# Patient Record
Sex: Female | Born: 1980 | Race: White | Hispanic: No | Marital: Married | Smoking: Never smoker
Health system: Southern US, Community
[De-identification: ages and names within clinical notes are randomized; demographics above are authoritative.]

## PROBLEM LIST (undated history)

## (undated) DIAGNOSIS — R87612 Low grade squamous intraepithelial lesion on cytologic smear of cervix (LGSIL): Secondary | ICD-10-CM

## (undated) DIAGNOSIS — J45909 Unspecified asthma, uncomplicated: Secondary | ICD-10-CM

## (undated) DIAGNOSIS — T7840XA Allergy, unspecified, initial encounter: Secondary | ICD-10-CM

## (undated) DIAGNOSIS — Z6710 Type A blood, Rh positive: Secondary | ICD-10-CM

## (undated) DIAGNOSIS — E079 Disorder of thyroid, unspecified: Secondary | ICD-10-CM

## (undated) DIAGNOSIS — E559 Vitamin D deficiency, unspecified: Secondary | ICD-10-CM

## (undated) DIAGNOSIS — B019 Varicella without complication: Secondary | ICD-10-CM

## (undated) DIAGNOSIS — Z8759 Personal history of other complications of pregnancy, childbirth and the puerperium: Secondary | ICD-10-CM

## (undated) HISTORY — DX: Varicella without complication: B01.9

## (undated) HISTORY — DX: Unspecified asthma, uncomplicated: J45.909

## (undated) HISTORY — DX: Vitamin D deficiency, unspecified: E55.9

## (undated) HISTORY — DX: Allergy, unspecified, initial encounter: T78.40XA

## (undated) HISTORY — DX: Disorder of thyroid, unspecified: E07.9

## (undated) HISTORY — DX: Low grade squamous intraepithelial lesion on cytologic smear of cervix (LGSIL): R87.612

## (undated) HISTORY — DX: Personal history of other complications of pregnancy, childbirth and the puerperium: Z87.59

## (undated) HISTORY — PX: NO PAST SURGERIES: SHX2092

## (undated) HISTORY — DX: Type A blood, Rh positive: Z67.10

---

## 2010-11-30 DIAGNOSIS — R87612 Low grade squamous intraepithelial lesion on cytologic smear of cervix (LGSIL): Secondary | ICD-10-CM

## 2010-11-30 HISTORY — DX: Low grade squamous intraepithelial lesion on cytologic smear of cervix (LGSIL): R87.612

## 2011-06-25 ENCOUNTER — Other Ambulatory Visit: Payer: Self-pay | Admitting: Endocrinology

## 2011-06-25 DIAGNOSIS — E0590 Thyrotoxicosis, unspecified without thyrotoxic crisis or storm: Secondary | ICD-10-CM

## 2011-07-01 ENCOUNTER — Ambulatory Visit (HOSPITAL_COMMUNITY): Payer: Self-pay

## 2011-07-02 ENCOUNTER — Encounter (HOSPITAL_COMMUNITY): Payer: Self-pay

## 2011-07-03 ENCOUNTER — Encounter (HOSPITAL_COMMUNITY)
Admission: RE | Admit: 2011-07-03 | Discharge: 2011-07-03 | Disposition: A | Discharge status: Home / Self Care | Payer: BC Managed Care – PPO | Source: Ambulatory Visit | Attending: Endocrinology | Admitting: Endocrinology

## 2011-07-03 DIAGNOSIS — E0590 Thyrotoxicosis, unspecified without thyrotoxic crisis or storm: Secondary | ICD-10-CM

## 2011-07-04 ENCOUNTER — Ambulatory Visit (HOSPITAL_COMMUNITY)
Admission: RE | Admit: 2011-07-04 | Discharge: 2011-07-04 | Disposition: A | Discharge status: Home / Self Care | Payer: BC Managed Care – PPO | Source: Ambulatory Visit | Attending: Endocrinology | Admitting: Endocrinology

## 2011-07-04 DIAGNOSIS — E0590 Thyrotoxicosis, unspecified without thyrotoxic crisis or storm: Secondary | ICD-10-CM | POA: Insufficient documentation

## 2011-07-04 DIAGNOSIS — R000 Tachycardia, unspecified: Secondary | ICD-10-CM | POA: Insufficient documentation

## 2011-07-04 MED ORDER — SODIUM IODIDE I 131 CAPSULE
16.0000 | Freq: Once | INTRAVENOUS | Status: AC | PRN
  Administered 2011-07-03: 16 via ORAL

## 2013-09-12 IMAGING — NM NM THYROID UPTAKE SINGLE (24 HR)
1 series · 2 of 2 positions shown · non-contrast
Comparison: 

CLINICAL DATA: Hyperthyroidism.  Elevated heart rate. TSH equals
0.009

NUCLEAR MEDICINE 24 HOUR THYROID UPTAKE
TECHNIQUE: Following  per oral administered of Q-BFB, thyroid
radioiodine uptake was calculated at  24 hours.
Radiopharmaceutical: [id number] YALONDA Q-BFB SODIUM IODIDE I 131
CAPSULE,

[Series 0: static - general purpose · 1.60mm/px · 2 of 2 slices shown]
[im 1/2]
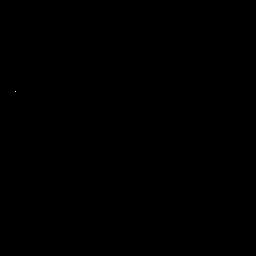
[im 2/2]
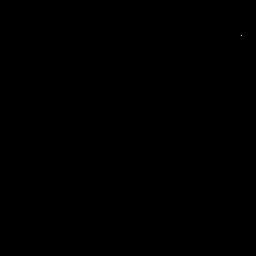

[2 of 2 positions shown; findings below may reference images not displayed]

FINDINGS: 24 hour I 131 uptake = 28.2 % (normal 10-30%
IMPRESSION: 24 hour I 131 uptake = 28.2 % (normal 10-30%)

## 2016-07-23 DIAGNOSIS — Z6710 Type A blood, Rh positive: Secondary | ICD-10-CM

## 2016-07-23 HISTORY — DX: Type A blood, Rh positive: Z67.10

## 2016-08-22 DIAGNOSIS — Z8759 Personal history of other complications of pregnancy, childbirth and the puerperium: Secondary | ICD-10-CM

## 2016-08-22 HISTORY — DX: Personal history of other complications of pregnancy, childbirth and the puerperium: Z87.59

## 2016-09-02 ENCOUNTER — Encounter: Payer: Self-pay | Admitting: Family Medicine

## 2016-09-02 ENCOUNTER — Ambulatory Visit (INDEPENDENT_AMBULATORY_CARE_PROVIDER_SITE_OTHER): Payer: 59 | Admitting: Family Medicine

## 2016-09-02 VITALS — BP 109/75 | HR 70 | Temp 98.3°F | Resp 18 | Ht 63.75 in | Wt 143.5 lb

## 2016-09-02 DIAGNOSIS — R194 Change in bowel habit: Secondary | ICD-10-CM | POA: Diagnosis not present

## 2016-09-02 DIAGNOSIS — R142 Eructation: Secondary | ICD-10-CM | POA: Diagnosis not present

## 2016-09-02 DIAGNOSIS — R140 Abdominal distension (gaseous): Secondary | ICD-10-CM

## 2016-09-02 DIAGNOSIS — R197 Diarrhea, unspecified: Secondary | ICD-10-CM | POA: Diagnosis not present

## 2016-09-02 DIAGNOSIS — Z7689 Persons encountering health services in other specified circumstances: Secondary | ICD-10-CM | POA: Diagnosis not present

## 2016-09-02 LAB — COMPREHENSIVE METABOLIC PANEL
ALBUMIN: 4.4 g/dL (ref 3.5–5.2)
ALK PHOS: 65 U/L (ref 39–117)
ALT: 13 U/L (ref 0–35)
AST: 15 U/L (ref 0–37)
BUN: 10 mg/dL (ref 6–23)
CO2: 31 mEq/L (ref 19–32)
Calcium: 9.6 mg/dL (ref 8.4–10.5)
Chloride: 105 mEq/L (ref 96–112)
Creatinine, Ser: 0.96 mg/dL (ref 0.40–1.20)
GFR: 69.12 mL/min (ref >60.00)
Glucose, Bld: 103 mg/dL — ABNORMAL HIGH (ref 70–99)
Potassium: 4.5 mEq/L (ref 3.5–5.1)
Sodium: 140 mEq/L (ref 135–145)
Total Bilirubin: 0.5 mg/dL (ref 0.2–1.2)
Total Protein: 7.1 g/dL (ref 6.0–8.3)

## 2016-09-02 LAB — H. PYLORI ANTIBODY, IGG: H PYLORI IGG: NEGATIVE

## 2016-09-02 NOTE — Patient Instructions (Signed)
First try to discontinue dairy for 4 weeks and see if there is a difference in symptoms.  We will test your blood work and call you with results.  Follow up and further studies will be discussed after results.   Lactose-Free Diet, Adult If you have lactose intolerance, you are not able to digest lactose. Lactose is a natural sugar found mainly in milk and milk products. You may need to avoid all foods and beverages that contain lactose. A lactose-free diet can help you do this. What do I need to know about this diet?  Do not consume foods, beverages, vitamins, minerals, or medicines with lactose. Read ingredients lists carefully.  Look for the words "lactose-free" on labels.  Use lactase enzyme drops or tablets as directed by your health care provider.  Use lactose-free milk or a milk alternative, such as soy milk, for drinking and cooking.  Make sure you get enough calcium and vitamin D in your diet. A lactose-free eating plan can be lacking in these important nutrients.  Take calcium and vitamin D supplements as directed by your health care provider. Talk to your provider about supplements if you are not able to get enough calcium and vitamin D from food. Which foods have lactose? Lactose is found in:  Milk and foods made from milk.  Yogurt.  Cheese.  Butter.  Margarine.  Sour cream.  Cream.  Whipped toppings and nondairy creamers.  Ice cream and other milk-based desserts.  Lactose is also found in foods or products made with milk or milk ingredients. To find out whether a food contains milk or a milk ingredient, look at the ingredients list. Avoid foods with the statement "May contain milk" and foods that contain:  Butter.  Cream.  Milk.  Milk solids.  Milk powder.  Whey.  Curd.  Caseinate.  Lactose.  Lactalbumin.  Lactoglobulin.  What are some alternatives to milk and foods made with milk products?  Lactose-free milk.  Soy milk with added  calcium and vitamin D.  Almond, coconut, or rice milk with added calcium and vitamin D. Note that these are low in protein.  Soy products, such as soy yogurt, soy cheese, soy ice cream, and soy-based sour cream. Which foods can I eat? Grains Breads and rolls made without milk, such as [place], [place], or [place] bread, bagels, pita, and [employer]. Corn tortillas, corn meal, grits, and polenta. Crackers without lactose or milk solids, such as soda crackers and graham crackers. Cooked or dry cereals without lactose or milk solids. Pasta, quinoa, couscous, barley, oats, bulgur, farro, rice, wild rice, or other grains prepared without milk or lactose. Plain popcorn. Vegetables Fresh, frozen, and canned vegetables without cheese, cream, or butter sauces. Fruits All fresh, canned, frozen, or dried fruits that are not processed with lactose. Meats and Other Protein Sources Plain beef, chicken, fish, [place], lamb, veal, pork, wild game, or ham. Kosher-prepared meat products. Strained or junior meats that do not contain milk. Eggs. Soy meat substitutes. Beans, lentils, and hummus. Tofu. Nuts and seeds. Peanut or other nut butters without lactose. Soups, casseroles, and mixed dishes without cheese, cream, or milk. Dairy Lactose-free milk. Soy, rice, or almond milk with added calcium and vitamin D. Soy cheese and yogurt. Beverages Carbonated drinks. Tea. Coffee, freeze-dried coffee, and some instant coffees. Fruit and vegetable juices. Condiments Soy sauce. Carob powder. Olives. Gravy made with water. Baker's cocoa. Rosita Fire. Pure seasonings and spices. Ketchup. Mustard. Bouillon. Broth. Sweets and Desserts Water and fruit ices. Gelatin. Cookies, pies,  or cakes made from allowed ingredients, such as angel food cake. Pudding made with water or a milk substitute. Lactose-free tofu desserts. Soy, coconut milk, or rice-milk-based frozen desserts. Sugar. Honey. Jam, jelly, and marmalade. Molasses. Pure sugar candy.  Dark chocolate without milk. Marshmallows. Fats and Oils Margarines and salad dressings that do not contain milk. Tomasa BlaseBacon. Vegetable oils. Shortening. Mayonnaise. Soy or coconut-based cream. The items listed above may not be a complete list of recommended foods or beverages. Contact your dietitian for more options. Which foods are not recommended? Grains Breads and rolls that contain milk. Toaster pastries. Muffins, biscuits, waffles, cornbread, and pancakes. These can be prepared at home, commercial, or from mixes. Sweet rolls, donuts, English muffins, fry bread, lefse, flour tortillas with lactose, or [place] toast made with milk or milk ingredients. Crackers that contain lactose. Corn curls. Cooked or dry cereals with lactose. Vegetables Creamed or breaded vegetables. Vegetables in a cheese or butter sauce or with lactose-containing margarines. Instant potatoes. [place] fries. Scalloped or au gratin potatoes. Fruits None. Meats and Other Protein Sources Scrambled eggs, omelets, and souffles that contain milk. Creamed or breaded meat, fish, chicken, or [place]. Sausage products, such as wieners and liver sausage. Cold cuts that contain milk solids. Cheese, cottage cheese, ricotta cheese, and cheese spreads. Lasagna and macaroni and cheese. Pizza. Peanut or other nut butters with added milk solids. Casseroles or mixed dishes containing milk or cheese. Dairy All dairy products, including milk, goat's milk, buttermilk, kefir, acidophilus milk, flavored milk, evaporated milk, condensed milk, dulce de [place], eggnog, yogurt, cheese, and cheese spreads. Beverages Hot chocolate. Cocoa with lactose. Instant iced teas. Powdered fruit drinks. Smoothies made with milk or yogurt. Condiments Chewing gum that has lactose. Cocoa that has lactose. Spice blends if they contain milk products. Artificial sweeteners that contain lactose. Nondairy creamers. Sweets and Desserts Ice cream, ice milk, gelato, sherbet, and  frozen yogurt. Custard, pudding, and mousse. Cake, cream pies, cookies, and other desserts containing milk, cream, cream cheese, or milk chocolate. Pie crust made with milk-containing margarine or butter. Reduced-calorie desserts made with a sugar substitute that contains lactose. Toffee and butterscotch. Milk, white, or dark chocolate that contains milk. Fudge. Caramel. Fats and Oils Margarines and salad dressings that contain milk or cheese. Cream. Half and half. Cream cheese. Sour cream. Chip dips made with sour cream or yogurt. The items listed above may not be a complete list of foods and beverages to avoid. Contact your dietitian for more information. Am I getting enough calcium? Calcium is found in many foods that contain lactose and is important for bone health. The amount of calcium you need depends on your age:  Adults younger than 50 years: 1000 mg of calcium a day.  Adults older than 50 years: 1200 mg of calcium a day.  If you are not getting enough calcium, other calcium sources include:  Orange juice with calcium added. There are 300-350 mg of calcium in 1 cup of orange juice.  Sardines with edible bones. There are 325 mg of calcium in 3 oz of sardines.  Calcium-fortified soy milk. There are 300-400 mg of calcium in 1 cup of calcium-fortified soy milk.  Calcium-fortified rice or almond milk. There are 300 mg of calcium in 1 cup of calcium-fortified rice or almond milk.  Canned salmon with edible bones. There are 180 mg of calcium in 3 oz of canned salmon with edible bones.  Calcium-fortified breakfast cereals. There are [phone] mg of calcium in calcium-fortified breakfast cereals.  Tofu  set with calcium sulfate. There are 250 mg of calcium in  cup of tofu set with calcium sulfate.  Spinach, cooked. There are 145 mg of calcium in  cup of cooked spinach.  Edamame, cooked. There are 130 mg of calcium in  cup of cooked edamame.  Collard greens, cooked. There are 125 mg of  calcium in  cup of cooked collard greens.  Kale, frozen or cooked. There are 90 mg of calcium in  cup of cooked or frozen kale.  Almonds. There are 95 mg of calcium in  cup of almonds.  Broccoli, cooked. There are 60 mg of calcium in 1 cup of cooked broccoli.  This information is not intended to replace advice given to you by your health care provider. Make sure you discuss any questions you have with your health care provider. Document Released: 08/30/2001 Document Revised: 08/16/2015 Document Reviewed: 06/10/2013 Elsevier Interactive Patient Education  2018 [employer].   Please help [place] help you:  We are honored you have chosen [facility] for your Primary Care home. Below you will find basic instructions that you may need to access in the future. Please help [place] help you by reading the instructions, which cover many of the frequent questions we experience.   Prescription refills and request:  -In order to allow more efficient response time, please call your pharmacy for all refills. They will forward the request electronically to [place]. This allows for the quickest possible response. Request left on a nurse line can take longer to refill, since these are checked as time allows between office patients and other phone calls.  - refill request can take up to 3-5 working days to complete.  - If request is sent electronically and request is appropiate, it is usually completed in 1-2 business days.  - all patients will need to be seen routinely for all chronic medical conditions requiring prescription medications (see follow-up below). If you are overdue for follow up on your condition, you will be asked to make an appointment and we will call in enough medication to cover you until your appointment (up to 30 days).  - all controlled substances will require a face to face visit to request/refill.  - if you desire your prescriptions to go through a new pharmacy, and have an active script at  original pharmacy, you will need to call your pharmacy and have scripts transferred to new pharmacy. This is completed between the pharmacy locations and not by your provider.    Results: If any images or labs were ordered, it can take up to 1 week to get results depending on the test ordered and the lab/facility running and resulting the test. - Normal or stable results, which do not need further discussion, may be released to your mychart immediately with attached note to you. A call may not be generated for normal results. Please make certain to sign up for mychart. If you have questions on how to activate your mychart you can call the front office.  - If your results need further discussion, our office will attempt to contact you via phone, and if unable to reach you after 2 attempts, we will release your abnormal result to your mychart with instructions.  - All results will be automatically released in mychart after 1 week.  - Your provider will provide you with explanation and instruction on all relevant material in your results. Please keep in mind, results and labs may appear confusing or abnormal to the  untrained eye, but it does not mean they are actually abnormal for you personally. If you have any questions about your results that are not covered, or you desire more detailed explanation than what was provided, you should make an appointment with your provider to do so.   Our office handles many outgoing and incoming calls daily. If we have not contacted you within 1 week about your results, please check your mychart to see if there is a message first and if not, then contact our office.  In helping with this matter, you help decrease call volume, and therefore allow [place] to be able to respond to patients needs more efficiently.   Acute office visits (sick visit):  An acute visit is intended for a new problem and are scheduled in shorter time slots to allow schedule openings for patients with  new problems. This is the appropriate visit to discuss a new problem. In order to provide you with excellent quality medical care with proper time for you to explain your problem, have an exam and receive treatment with instructions, these appointments should be limited to one new problem per visit. If you experience a new problem, in which you desire to be addressed, please make an acute office visit, we save openings on the schedule to accommodate you. Please do not save your new problem for any other type of visit, let us take care of it properly and quickly for you.   Follow up visits:  Depending on your condition(s) your provider will need to see you routinely in order to provide you with quality care and prescribe medication(s). Most chronic conditions (Example: hypertension, Diabetes, depression/anxiety... etc), require visits a couple times a year. Your provider will instruct you on proper follow up for your personal medical conditions and history. Please make certain to make follow up appointments for your condition as instructed. Failing to do so could result in lapse in your medication treatment/refills. If you request a refill, and are overdue to be seen on a condition, we will always provide you with a 30 day script (once) to allow you time to schedule.    Medicare wellness (well visit): - we have a wonderful Nurse Selena Batten), that will meet with you and provide you will yearly medicare wellness visits. These visits should occur yearly (can not be scheduled less than 1 calendar year apart) and cover preventive health, immunizations, advance directives and screenings you are entitled to yearly through your medicare benefits. Do not miss out on your entitled benefits, this is when medicare will pay for these benefits to be ordered for you.  These are strongly encouraged by your provider and is the appropriate type of visit to make certain you are up to date with all preventive health benefits. If you  have not had your medicare wellness exam in the last 12 months, please make certain to schedule one by calling the office and schedule your medicare wellness with Selena Batten as soon as possible.   Yearly physical (well visit):  - Adults are recommended to be seen yearly for physicals. Check with your insurance and date of your last physical, most insurances require one calendar year between physicals. Physicals include all preventive health topics, screenings, medical exam and labs that are appropriate for gender/age and history. You may have fasting labs needed at this visit. This is a well visit (not a sick visit), new problems should not be covered during this visit (see acute visit).  - Pediatric patients are seen more  frequently when they are younger. Your provider will advise you on well child visit timing that is appropriate for your their age. - This is not a medicare wellness visit. Medicare wellness exams do not have an exam portion to the visit. Some medicare companies allow for a physical, some do not allow a yearly physical. If your medicare allows a yearly physical you can schedule the medicare wellness with our nurse Selena Batten and have your physical with your provider after, on the same day. Please check with insurance for your full benefits.   Late Policy/No Shows:  - all new patients should arrive 15-30 minutes earlier than appointment to allow [place] time  to  obtain all personal demographics,  insurance information and for you to complete office paperwork. - All established patients should arrive 10-15 minutes earlier than appointment time to update all information and be checked in .  - In our best efforts to run on time, if you are late for your appointment you will be asked to either reschedule or if able, we will work you back into the schedule. There will be a wait time to work you back in the schedule,  depending on availability.  - If you are unable to make it to your appointment as scheduled,  please call 24 hours ahead of time to allow [place] to fill the time slot with someone else who needs to be seen. If you do not cancel your appointment ahead of time, you may be charged a no show fee.

## 2016-09-02 NOTE — Progress Notes (Signed)
Patient ID: Molly Torres, female  DOB: January 24, 1981, 36 y.o.   MRN: [id number] Patient Care Team    Relationship Specialty Notifications Start End  Ma Hillock, DO PCP - General Family Medicine  09/02/16   Delsa Bern, MD Consulting Physician Obstetrics and Gynecology  09/02/16     Chief Complaint  Patient presents with  . Establish Care  . GI Problem    bloating,gas,strong smell to BM    Subjective:  Molly Torres is a 36 y.o.  female present for new patient establishment. All past medical history, surgical history, allergies, family history, immunizations, medications and social history were obtained in the electronic medical record today. All recent labs, ED visits and hospitalizations within the last year were reviewed.  GI discomfort: Pt presents for new pt establishment with complaints of GI discomfort and bowel changes since January. She unfortunately just experienced a miscarriage last week. She reports she was [redacted] weeks pregnant and found by [place] that the fetus did not have a heartbeat and measured 8 weeks only. She states GI symptoms started prior to pregnancy. She feels she has had 10 # of weight gain that she has been unable to lose, belching increase, bloating. She reports  abdominal cramping and  Loose/watery stools. She has one BM day. She feels the stools have an increase in odor, lighter color and more cramping over the last few weeks. She denies diet changes, travel, antibiotic use, camping prior to onset. She denies h/o IBS/IBD personally or in her family. Her father had stomach cancer, that she states was misdiagnosed as GERD-like symptoms. She denies melena, hematochezia, weight loss, early satiety, weight loss or vomiting.  She reports no prior h/o food allergies, GERD, celiac or lactose intolerance.  She has h/o thyroid disorder. TSH (1.18) normal 07/2016 at GYN office, thyroid panel completed as well an normal., CBC normal. Vit D was mildly low at 27. Reports prior  endocrinologist, iodine tracer and methimazole use.   Health maintenance:  Colonoscopy: no fhx colon cancer, father with Gastric cancer.  Mammogram: No fhx, screen at 38, pt has GYN Cervical cancer screening: established with GYN, Miscarriage last week. Last pap ? 11/30/2010 with LGSIL, mild dysplasia per EMR. Do not see later records or follow up. Current Ob/GYN Dr. Cletis Media.  Immunizations:unknown immunization record.  Infectious disease screening: HIV unknown screening.    Depression screen [facility] 2/9 09/02/2016  Decreased Interest 0  Down, Depressed, Hopeless 0  PHQ - 2 Score 0   No flowsheet data found.  Current Exercise Habits: Home exercise routine;Structured exercise class, Type of exercise: walking (eliptical/stationary bike), Time (Minutes): 25, Frequency (Times/Week): 5, Weekly Exercise (Minutes/Week): 125, Intensity: Moderate   Fall Risk  09/02/2016  Falls in the past year? No    There is no immunization history on file for this patient.  No exam data present  Past Medical History:  Diagnosis Date  . Allergy   . Asthma    not as much as an issue since moving.   . Blood type A+ 07/23/2016  . Chicken pox   . 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 Miscarriage within last 12 months 08/22/2016  . Thyroid disease    hyperthyroid  . Vitamin D insufficiency    Allergies  Allergen Reactions  . Poultry Meal     Other reaction(s): Edema   Past Surgical History:  Procedure Laterality Date  . NO PAST SURGERIES  Family History  Problem Relation Age of Onset  . Arthritis Mother   . COPD Mother   . Kidney failure Mother   . Stomach cancer Father   . Asthma Father   . Brain cancer Paternal Aunt   . Heart disease Maternal Grandmother   . Stroke Maternal Grandfather   . Liver disease Paternal Grandmother   . Stroke Paternal Grandfather   . Celiac disease Paternal Aunt    Social History   Social History   . Marital status: Married    Spouse name: Shanon Brow  . Number of children: 2  . Years of education: 35   Occupational History  . [employer]    Social History Main Topics  . Smoking status: Never Smoker  . Smokeless tobacco: Never Used  . Alcohol use 1.2 - 1.8 oz/week    1 - 2 Glasses of wine, 1 Cans of beer per week  . Drug use: No  . Sexual activity: Yes    Partners: Male   Other Topics Concern  . Not on file   Social History Narrative   Married to [place]. They have 2 children.    Master's Degree. SAHM.    Drinks caffeine. Uses herbal remedies. Takes a daily vitamin.    Wears her seatbelt, smoke detector in the home.    exercisers routinely.    Feels safe in her relationships.    Allergies as of 09/02/2016      Reactions   Poultry Meal    Other reaction(s): Edema      Medication List       Accurate as of 09/02/16 11:59 PM. Always use your most recent med list.          ADVAIR DISKUS 250-50 MCG/DOSE Aepb Generic drug:  Fluticasone-Salmeterol Inhale 1 puff into the lungs 2 (two) times daily.   Albuterol Sulfate 108 (90 Base) MCG/ACT Aepb Inhale 1 puff into the lungs every 4 (four) hours as needed.       All past medical history, surgical history, allergies, family history, immunizations andmedications were updated in the EMR today and reviewed under the history and medication portions of their EMR.    Recent Results (from the past 2160 hour(s))  Comp Met (CMET)     Status: Abnormal   Collection Time: 09/02/16  1:45 PM  Result Value Ref Range   Sodium 140 135 - 145 mEq/L   Potassium 4.5 3.5 - 5.1 mEq/L   Chloride 105 96 - 112 mEq/L   CO2 31 19 - 32 mEq/L   Glucose, Bld 103 (H) 70 - 99 mg/dL   BUN 10 6 - 23 mg/dL   Creatinine, Ser 0.96 0.40 - 1.20 mg/dL   Total Bilirubin 0.5 0.2 - 1.2 mg/dL   Alkaline Phosphatase 65 39 - 117 U/L   AST 15 0 - 37 U/L   ALT 13 0 - 35 U/L   Total Protein 7.1 6.0 - 8.3 g/dL   Albumin 4.4 3.5 - 5.2 g/dL   Calcium 9.6 8.4 - 10.5 mg/dL    GFR 69.12 >60.00 mL/min  H. pylori antibody, IgG     Status: None   Collection Time: 09/02/16  1:45 PM  Result Value Ref Range   H Pylori IgG Negative Negative    Nm Thyroid Uptake Single  Result Date: 07/04/2011 *RADIOLOGY REPORT* Clinical Data: Hyperthyroidism.  Elevated heart rate. TSH equals 0.009 NUCLEAR MEDICINE 24 HOUR THYROID UPTAKE Technique:  Following  per oral administered of I-131, thyroid radioiodine uptake was  calculated at  24 hours. Radiopharmaceutical: 16MICRO CURIE I-131 SODIUM IODIDE I 131 CAPSULE, Comparison: Findings: 24 hour I 131 uptake = 28.2 % (normal 10-30% IMPRESSION: 24 hour I 131 uptake = 28.2 % (normal 10-30%) Original Report Authenticated By: Suzy Bouchard, M.D.    ROS: 14 pt review of systems performed and negative (unless mentioned in an HPI)  Objective: BP 109/75 (BP Location: Right Arm, Patient Position: Sitting, Cuff Size: Normal)   Pulse 70   Temp 98.3 F (36.8 C)   Resp 18   Ht 5' 3.75" (1.619 m)   Wt 143 lb 8 oz (65.1 kg)   LMP 08/29/2016   SpO2 98%   BMI 24.83 kg/m  Gen: Afebrile. No acute distress. Nontoxic in appearance, well-developed, well-nourished,  Pleasant caucasian female. HENT: AT. [place]. MMM, no oral lesions Eyes:Pupils Equal Round Reactive to light, Extraocular movements intact,  Conjunctiva without redness, discharge or icterus. Neck/lymp/endocrine: Supple,no lymphadenopathy, no thyromegaly CV: RRR no murmur, no edema, +2/4 P posterior tibialis pulses.  Chest: CTAB, no wheeze, rhonchi or crackles.  Abd: Soft. flat. NTND. BS present. no Masses palpated. No hepatosplenomegaly. No rebound tenderness or guarding. Skin: no rashes, purpura or petechiae. Warm and well-perfused. Skin intact. Neuro/Msk:  Normal gait. PERLA. EOMi. Alert. Oriented x3.   Psych: Normal affect, dress and demeanor. Normal speech. Normal thought content and judgment.   Assessment/plan: Molly Torres is a 36 y.o. female present for establish care.    Belching/Bloating Diarrhea, unspecified type/change in bowel habits - new problem - hesitant to perform inflammatory markers with recent Miscarriage ( less than 1 week ago). Per pt all GI symptoms preceded pregnancy. -  I am unable to verify consistent GYN followup after abnormal PAP in 2012. Pt did not mention abnormal PAP or date of her last PAP. Results were found by EMR search. She reports she does have an appt coming up next week to follow up with new GYN.  - Comp Met (CMET) - H. pylori antibody, IgG - Gliadin antibodies, serum - Tissue transglutaminase, IgA  - Reticulin Antibody, IgA w reflex titer - DDX; thyroid, GERD, H.Pylori, allergy/sensitivy, pancreatic etiology ([place] in father "stomach cancer"), IBS/IBD, GYN cause with CIN 1 in 2012. - if labs negative, stool studies should be obtained.  - avoid diary for 4 weeks for trial.  - could consider: bentyl and/or h. Pylori tx if appropriate, ALIGN probiotic - low threshold to GI refer or image with Fhx. Return if symptoms worsen or fail to improve.  Close follow up, dependent on labs.   Greater than 45 minutes was spent with patient, greater than 50% of that time was spent face-to-face with patient counseling and/or coordinating care. All history obtained and chart built today, as well EMR review for additional history and prior studies/lab reviewed.    Note is dictated utilizing voice recognition software. Although note has been proof read prior to signing, occasional typographical errors still can be missed. If any questions arise, please do not hesitate to call for verification.   Electronically signed by: Howard Pouch, DO [place]

## 2016-09-03 ENCOUNTER — Encounter: Payer: Self-pay | Admitting: Family Medicine

## 2016-09-03 DIAGNOSIS — R194 Change in bowel habit: Secondary | ICD-10-CM | POA: Insufficient documentation

## 2016-09-04 LAB — GLIADIN ANTIBODIES, SERUM
GLIADIN IGA: 5 U (ref <20)
GLIADIN IGG: 5 U (ref <20)

## 2016-09-04 LAB — RETICULIN ANTIBODIES, IGA W TITER: RETICULIN AB, IGA: NEGATIVE

## 2016-09-04 LAB — TISSUE TRANSGLUTAMINASE, IGA: Tissue Transglutaminase Ab, IgA: 1 U/mL (ref <4)

## 2016-09-05 ENCOUNTER — Telehealth: Payer: Self-pay | Admitting: Family Medicine

## 2016-09-05 DIAGNOSIS — R194 Change in bowel habit: Secondary | ICD-10-CM

## 2016-09-05 DIAGNOSIS — R197 Diarrhea, unspecified: Secondary | ICD-10-CM

## 2016-09-05 NOTE — Telephone Encounter (Signed)
Spoke with patient reviewed lab reuslts and information. Patient has not had a PAP since 2012 but she will schedule with her GYN for follow up. Patient will pick up stool kit. Place stool kit at front desk for patient pick up.

## 2016-09-05 NOTE — Telephone Encounter (Signed)
Please call pt: - her labs are all normal.  - I would like her to start a probiotic (ALIGN) is OTC and a good choice. This will aide in GI health.  - continue to cut back on dairy. - please ask if she has had a PAP since 11/2010? She was referred to GYN by her records at that time for abnormal PAP, but I do not have records of any follow up.  - I would like her to also have stool studies. She will need to pick up the kit and complete as soon as possible. Follow up 2-3 weeks, after above dietary changes. If symptoms still present will need to further investigate.

## 2016-09-09 LAB — FECAL LACTOFERRIN, QUANT: Lactoferrin: POSITIVE

## 2016-09-12 ENCOUNTER — Telehealth: Payer: Self-pay | Admitting: Family Medicine

## 2016-09-12 LAB — STOOL CULTURE

## 2016-09-12 NOTE — Telephone Encounter (Signed)
Patient inquiring if results for stool tests are available yet?  Please advise

## 2016-09-12 NOTE — Telephone Encounter (Signed)
Please call pt: - Pt should be educated labs can take up to 1 week to return. The results were to be discussed fully on her FOLLOW UP APPT WHICH SHE DID NOT SCHEDULE AND MUST SCHEDULE NOW! - Briefly, her stool sample received 09/08/2016 to lab did result this morning and did not grow any concerning bacteria, but they are continue to hold for regrowth. We will only hear back from them if it does have growth at this point.  - It did show some white cells, which could be inflammation but is non-specific. She is to continue the recommendations of avoiding dairy products and start probiotic use daily.  - further recommendations and/or investigation, if appropriate, will be discussed and ordered at her follow up visit.

## 2016-09-12 NOTE — Telephone Encounter (Signed)
Left message for patient see previous note

## 2016-09-12 NOTE — Telephone Encounter (Signed)
Left detailed message for patient to return call to schedule a follow up appt for next week to review lab results in detail.[facility]

## 2016-09-15 ENCOUNTER — Telehealth: Payer: Self-pay | Admitting: Family Medicine

## 2016-09-15 NOTE — Telephone Encounter (Signed)
Spoke with patient let her know the appointment is to review lab results and further recommendations.

## 2016-09-15 NOTE — Telephone Encounter (Signed)
Patient is requesting CB. She has made an appointment for Thursday 6/28 but would like to know what she will be talking to Dr. Claiborne BillingsKuneff about.

## 2016-09-18 ENCOUNTER — Encounter: Payer: Self-pay | Admitting: Family Medicine

## 2016-09-18 ENCOUNTER — Ambulatory Visit (INDEPENDENT_AMBULATORY_CARE_PROVIDER_SITE_OTHER): Payer: 59 | Admitting: Family Medicine

## 2016-09-18 VITALS — BP 115/77 | HR 90 | Temp 98.6°F | Resp 18 | Ht 64.0 in | Wt 142.8 lb

## 2016-09-18 DIAGNOSIS — R194 Change in bowel habit: Secondary | ICD-10-CM | POA: Diagnosis not present

## 2016-09-18 DIAGNOSIS — R142 Eructation: Secondary | ICD-10-CM

## 2016-09-18 DIAGNOSIS — R197 Diarrhea, unspecified: Secondary | ICD-10-CM | POA: Diagnosis not present

## 2016-09-18 DIAGNOSIS — R140 Abdominal distension (gaseous): Secondary | ICD-10-CM | POA: Diagnosis not present

## 2016-09-18 LAB — C-REACTIVE PROTEIN

## 2016-09-18 LAB — SEDIMENTATION RATE: Sed Rate: 3 mm/hr (ref 0–20)

## 2016-09-18 MED ORDER — OMEPRAZOLE 40 MG PO CPDR: 40.0000 mg | DELAYED_RELEASE_CAPSULE | Freq: Every day | ORAL | 2 refills | Status: DC

## 2016-09-18 NOTE — Progress Notes (Signed)
Patient ID: Molly Torres, female  DOB: [birth year], 36 y.o.   MRN: [id number] Patient Care Team    Relationship Specialty Notifications Start End  Ma Hillock, DO PCP - General Family Medicine  09/02/16   Delsa Bern, MD Consulting Physician Obstetrics and Gynecology  09/02/16     Chief Complaint  Patient presents with  . Results    labs    Subjective:  Molly Torres is a 36 y.o.  female present for followup  GI discomfort:  Pt presents for follow up on her GI discomfort/stool changes. She started the [facility] and has been monitoring her food/triggers. She did not feel the lactose/milk products avoidance Has made that much difference. She had noticed fast food seems to make her condition worse. Her labs were reviewed with her today in detail and explained Stool cultures normal, CMP unremarkable, CBC at GYN office WNL, Celiac panel negative, Lactoferrin POSITIVE. Inflammatory markers and FOBT were unable to be obtained at first visit secondary to pt was going through a miscarriage. She still endorses belching, bloating and bowel habit changes. She reports she only has 1 bowel movement a day now, however the consistency, color and amount has changed. She has not had any unintentional weight loss, and actually complains of weight gain.  Prior note 09/02/2016: Pt presents for new pt establishment with complaints of GI discomfort and bowel changes since January. She unfortunately just experienced a miscarriage last week. She reports she was [redacted] weeks pregnant and found by [place] that the fetus did not have a heartbeat and measured 8 weeks only. She states GI symptoms started prior to pregnancy. She feels she has had 10 # of weight gain that she has been unable to lose, belching increase, bloating. She reports  abdominal cramping and  Loose/watery stools. She has one BM day. She feels the stools have an increase in odor, lighter color and more cramping over the last few weeks. She denies diet changes,  travel, antibiotic use, camping prior to onset. She denies h/o IBS/IBD personally or in her family. Her father had stomach cancer, that she states was misdiagnosed as GERD-like symptoms. She denies melena, hematochezia, weight loss, early satiety,  or vomiting.  She reports no prior h/o food allergies, GERD, celiac or lactose intolerance.  She has h/o thyroid disorder. TSH (1.18) normal 07/2016 at GYN office, thyroid panel completed as well an normal., CBC normal. Vit D was mildly low at 27. Reports prior endocrinologist, iodine tracer and methimazole use.    Depression screen [facility] 2/9 09/02/2016  Decreased Interest 0  Down, Depressed, Hopeless 0  PHQ - 2 Score 0   No flowsheet data found.      Fall Risk  09/02/2016  Falls in the past year? No    There is no immunization history on file for this patient.  No exam data present  Past Medical History:  Diagnosis Date  . Allergy   . Asthma    not as much as an issue since moving.   . Blood type A+ 07/23/2016  . Chicken pox   . 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 Miscarriage within last 12 months 08/22/2016  . Thyroid disease    hyperthyroid  . Vitamin D insufficiency    Allergies  Allergen Reactions  . Poultry Meal     Other reaction(s): Edema   Past Surgical History:  Procedure Laterality Date  . NO PAST  SURGERIES     Family History  Problem Relation Age of Onset  . Arthritis Mother   . COPD Mother   . Kidney failure Mother   . Stomach cancer Father   . Asthma Father   . Brain cancer Paternal Aunt   . Heart disease Maternal Grandmother   . Stroke Maternal Grandfather   . Liver disease Paternal Grandmother   . Stroke Paternal Grandfather   . Celiac disease Paternal Aunt    Social History   Social History  . Marital status: Married    Spouse name: Shanon Brow  . Number of children: 2  . Years of education: 41   Occupational History  . [employer]     Social History Main Topics  . Smoking status: Never Smoker  . Smokeless tobacco: Never Used  . Alcohol use 1.2 - 1.8 oz/week    1 - 2 Glasses of wine, 1 Cans of beer per week  . Drug use: No  . Sexual activity: Yes    Partners: Male   Other Topics Concern  . Not on file   Social History Narrative   Married to [place]. They have 2 children.    Master's Degree. SAHM.    Drinks caffeine. Uses herbal remedies. Takes a daily vitamin.    Wears her seatbelt, smoke detector in the home.    exercisers routinely.    Feels safe in her relationships.    Allergies as of 09/18/2016      Reactions   Poultry Meal    Other reaction(s): Edema      Medication List       Accurate as of 09/18/16  8:27 AM. Always use your most recent med list.          ADVAIR DISKUS 250-50 MCG/DOSE Aepb Generic drug:  Fluticasone-Salmeterol Inhale 1 puff into the lungs 2 (two) times daily.   Albuterol Sulfate 108 (90 Base) MCG/ACT Aepb Inhale 1 puff into the lungs every 4 (four) hours as needed.       All past medical history, surgical history, allergies, family history, immunizations andmedications were updated in the EMR today and reviewed under the history and medication portions of their EMR.    Recent Results (from the past 2160 hour(s))  Comp Met (CMET)     Status: Abnormal   Collection Time: 09/02/16  1:45 PM  Result Value Ref Range   Sodium 140 135 - 145 mEq/L   Potassium 4.5 3.5 - 5.1 mEq/L   Chloride 105 96 - 112 mEq/L   CO2 31 19 - 32 mEq/L   Glucose, Bld 103 (H) 70 - 99 mg/dL   BUN 10 6 - 23 mg/dL   Creatinine, Ser 0.96 0.40 - 1.20 mg/dL   Total Bilirubin 0.5 0.2 - 1.2 mg/dL   Alkaline Phosphatase 65 39 - 117 U/L   AST 15 0 - 37 U/L   ALT 13 0 - 35 U/L   Total Protein 7.1 6.0 - 8.3 g/dL   Albumin 4.4 3.5 - 5.2 g/dL   Calcium 9.6 8.4 - 10.5 mg/dL   GFR 69.12 >60.00 mL/min  H. pylori antibody, IgG     Status: None   Collection Time: 09/02/16  1:45 PM  Result Value Ref Range   H  Pylori IgG Negative Negative  Gliadin antibodies, serum     Status: None   Collection Time: 09/02/16  1:45 PM  Result Value Ref Range   Gliadin IgG 5 <20 Units    Comment: Value Interpretation:  <  20:   Antibody Not Detected >=20:   Antibody Detected    Gliadin IgA 5 <20 Units    Comment: Value Interpretation:  <20:   Antibody Not Detected >=20:   Antibody Detected   Tissue transglutaminase, IgA     Status: None   Collection Time: 09/02/16  1:45 PM  Result Value Ref Range   Tissue Transglutaminase Ab, IgA 1 <4 U/mL    Comment: Value Interpretation:   <4:   Antibody Not Detected  >=4:   Antibody Detected   Reticulin Antibody, IgA w reflex titer     Status: None   Collection Time: 09/02/16  1:45 PM  Result Value Ref Range   Reticulin Ab, IgA Negative Negative   Additional Testing REPORT     Comment: not indicated.  Stool culture     Status: None   Collection Time: 09/08/16  8:09 AM  Result Value Ref Range   Organism ID, Bacteria NO SALMONELLA OR SHIGELLA ISOLATED     Comment: Reduced Normal Flora present NO ENTERIC CAMPYLOBACTER ISOLATED NO ESCHERICHIA COLI 0157 ISOLATED   Stool, WBC/Lactoferrin     Status: None   Collection Time: 09/08/16  8:09 AM  Result Value Ref Range   Lactoferrin POSITIVE     Nm Thyroid Uptake Single Result Date: 07/04/2011 *RADIOLOGY REPORT* Clinical Data: Hyperthyroidism.  Elevated heart rate. TSH equals 0.009 NUCLEAR MEDICINE 24 HOUR THYROID UPTAKE Technique:  Following  per oral administered of I-131, thyroid radioiodine uptake was calculated at  24 hours. Radiopharmaceutical: 16MICRO CURIE I-131 SODIUM IODIDE I 131 CAPSULE, Comparison: Findings: 24 hour I 131 uptake = 28.2 % (normal 10-30% IMPRESSION: 24 hour I 131 uptake = 28.2 % (normal 10-30%) Original Report Authenticated By: Suzy Bouchard, M.D.   ROS: 14 pt review of systems performed and negative (unless mentioned in an HPI)  Objective: BP 115/77 (BP Location: Left Arm, Patient  Position: Sitting, Cuff Size: Normal)   Pulse 90   Temp 98.6 F (37 C)   Resp 18   Ht 5' 4"  (1.626 m)   Wt 142 lb 12 oz (64.8 kg)   LMP 08/29/2016   SpO2 99%   BMI 24.50 kg/m  Gen: Afebrile. No acute distress.  HENT: AT. [place]. MMM.  Eyes:Pupils Equal Round Reactive to light, Extraocular movements intact,  Conjunctiva without redness, discharge or icterus. CV: RRR  Abd: Soft. flat. NTND. BS present (normal). no Masses palpated.  Neuro: Normal gait. PERLA. EOMi. Alert. Oriented.  Assessment/plan: TYERRA LORETTO is a 36 y.o. female present for establish care.  Belching/Bloating Diarrhea, unspecified type/change in bowel habits - CMET, celiac, stool cultures are negative.  - Lactoferrin was positive.  - ESR, CRP collected today. FOBT home kit provided.  - Pt to continue align, monitor diet and avoid food triggers (which seem to be surrounding fast food). - Start PPI for 2-4, weeks, if improvement noted would extend therapy to 8-12 weeks. - Pt has GYN appt next week to have PAP completed. She will have them forward results to [place]. 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  - could consider: IBS meds, however with lactoferrin positive would want to rule out inflammatory process and likely will refer to gastroenterology which she seems to desire by our conversation today. - low threshold to GI refer or image with Fhx (stomach cancer). Discussed this with her today and agreed to wait on lab results, give PPI and dietary changes a chance and rule out GYN cause with  PAP next week with her GYN.  F/U dependent on lab results. Will advise of plan after results received.   > 25 minutes spent with patient, >50% of time spent face to face counseling and/or coordinating care.   Note is dictated utilizing voice recognition software. Although note has been proof read prior to signing, occasional typographical errors still can be missed. If any questions arise, please do not hesitate to call for  verification.   Electronically signed by: Howard Pouch, DO [place]

## 2016-09-18 NOTE — Patient Instructions (Signed)
Complete the stool kit provided to you today.  Continue the Align daily. Start the omeprazole for at least 2 weeks, longer if getting benefit (8-12 weeks). Try to avoid trigger foods.   Let us know the results to your PAP once completed.  I have provided you with the report from your last PAP.   Diet for Irritable Bowel Syndrome When you have irritable bowel syndrome (IBS), the foods you eat and your eating habits are very important. IBS may cause various symptoms, such as abdominal pain, constipation, or diarrhea. Choosing the right foods can help ease discomfort caused by these symptoms. Work with your health care provider and dietitian to find the best eating plan to help control your symptoms. What general guidelines do I need to follow?  Keep a food diary. This will help you identify foods that cause symptoms. Write down: ? What you eat and when. ? What symptoms you have. ? When symptoms occur in relation to your meals.  Avoid foods that cause symptoms. Talk with your dietitian about other ways to get the same nutrients that are in these foods.  Eat more foods that contain fiber. Take a fiber supplement if directed by your dietitian.  Eat your meals slowly, in a relaxed setting.  Aim to eat 5-6 small meals per day. Do not skip meals.  Drink enough fluids to keep your urine clear or pale yellow.  Ask your health care provider if you should take an over-the-counter probiotic during flare-ups to help restore healthy gut bacteria.  If you have cramping or diarrhea, try making your meals low in fat and high in carbohydrates. Examples of carbohydrates are pasta, rice, whole grain breads and cereals, fruits, and vegetables.  If dairy products cause your symptoms to flare up, try eating less of them. You might be able to handle yogurt better than other dairy products because it contains bacteria that help with digestion. What foods are not recommended? The following are some foods and  drinks that may worsen your symptoms:  Fatty foods, such as [place] fries.  Milk products, such as cheese or ice cream.  Chocolate.  Alcohol.  Products with caffeine, such as coffee.  Carbonated drinks, such as soda.  The items listed above may not be a complete list of foods and beverages to avoid. Contact your dietitian for more information. What foods are good sources of fiber? Your health care provider or dietitian may recommend that you eat more foods that contain fiber. Fiber can help reduce constipation and other IBS symptoms. Add foods with fiber to your diet a little at a time so that your body can get used to them. Too much fiber at once might cause gas and swelling of your abdomen. The following are some foods that are good sources of fiber:  Apples.  Peaches.  Pears.  Berries.  Figs.  Broccoli (raw).  Cabbage.  Carrots.  Raw peas.  Kidney beans.  Lima beans.  Whole grain bread.  Whole grain cereal.  Where to find more information: [employer] for Functional Gastrointestinal Disorders: www.iffgd.[employer] of Diabetes and Digestive and Kidney Diseases: NetworkAffair.co.za.aspx This information is not intended to replace advice given to you by your health care provider. Make sure you discuss any questions you have with your health care provider. Document Released: 05/31/2003 Document Revised: 08/16/2015 Document Reviewed: 06/10/2013 Elsevier Interactive Patient Education  2018 [employer].

## 2016-09-19 ENCOUNTER — Encounter: Payer: Self-pay | Admitting: *Deleted

## 2016-09-19 ENCOUNTER — Telehealth: Payer: Self-pay | Admitting: Family Medicine

## 2016-09-19 DIAGNOSIS — R142 Eructation: Secondary | ICD-10-CM

## 2016-09-19 DIAGNOSIS — R197 Diarrhea, unspecified: Secondary | ICD-10-CM

## 2016-09-19 DIAGNOSIS — Z800 Family history of malignant neoplasm of digestive organs: Secondary | ICD-10-CM

## 2016-09-19 DIAGNOSIS — R194 Change in bowel habit: Secondary | ICD-10-CM

## 2016-09-19 DIAGNOSIS — R140 Abdominal distension (gaseous): Secondary | ICD-10-CM

## 2016-09-19 NOTE — Telephone Encounter (Signed)
Please call pt: - her inflammatory markers are negative.  - Once we get the FOBT returned we will call with those results.  - I would like her to still collect the FOBT take the omeprazole, and forward us her PAP results after it returns. I am not against referring her now to GI, since I know she is worried given her FHX (father gastric cancer). It will take a few weeks to get her seen any way.  I placed that referral for her today.

## 2016-09-19 NOTE — Telephone Encounter (Signed)
Left detailed message on patient voice mail and [facility]

## 2016-09-23 ENCOUNTER — Encounter: Payer: Self-pay | Admitting: Gastroenterology

## 2016-09-23 ENCOUNTER — Telehealth: Payer: Self-pay | Admitting: Family Medicine

## 2016-09-23 ENCOUNTER — Other Ambulatory Visit: Payer: 59

## 2016-09-23 DIAGNOSIS — R140 Abdominal distension (gaseous): Secondary | ICD-10-CM

## 2016-09-23 DIAGNOSIS — R197 Diarrhea, unspecified: Secondary | ICD-10-CM

## 2016-09-23 DIAGNOSIS — R194 Change in bowel habit: Secondary | ICD-10-CM

## 2016-09-23 LAB — HM PAP SMEAR: HM Pap smear: NORMAL

## 2016-09-23 LAB — HEMOCCULT SLIDES (X 3 CARDS)
FECAL OCCULT BLD: NEGATIVE
OCCULT 1: NEGATIVE
OCCULT 2: NEGATIVE
OCCULT 3: NEGATIVE
OCCULT 4: NEGATIVE
OCCULT 5: NEGATIVE

## 2016-09-23 NOTE — Telephone Encounter (Signed)
Please call patient, Hemoccult slides are negative. 

## 2016-09-25 NOTE — Telephone Encounter (Signed)
Patient notified.and verbalized understanding. Patient stated that she saw Ob/Gyn physician this week and increased her Vitamin D and is sending notes to Dr. Claiborne BillingsKuneff.

## 2016-10-23 ENCOUNTER — Ambulatory Visit: Payer: 59 | Admitting: Gastroenterology

## 2016-12-03 ENCOUNTER — Encounter: Payer: Self-pay | Admitting: Family Medicine

## 2016-12-03 ENCOUNTER — Ambulatory Visit (INDEPENDENT_AMBULATORY_CARE_PROVIDER_SITE_OTHER): Payer: 59 | Admitting: Family Medicine

## 2016-12-03 VITALS — BP 134/92 | HR 63 | Temp 98.1°F | Wt 146.2 lb

## 2016-12-03 DIAGNOSIS — N926 Irregular menstruation, unspecified: Secondary | ICD-10-CM | POA: Diagnosis not present

## 2016-12-03 DIAGNOSIS — L659 Nonscarring hair loss, unspecified: Secondary | ICD-10-CM

## 2016-12-03 DIAGNOSIS — R5383 Other fatigue: Secondary | ICD-10-CM | POA: Diagnosis not present

## 2016-12-03 DIAGNOSIS — R635 Abnormal weight gain: Secondary | ICD-10-CM

## 2016-12-03 DIAGNOSIS — E0590 Thyrotoxicosis, unspecified without thyrotoxic crisis or storm: Secondary | ICD-10-CM | POA: Diagnosis not present

## 2016-12-03 NOTE — Patient Instructions (Signed)
We will call you with lab results once available.  If they are normal, will refer to specialist for you. Until that time please continue to follow the instructions from GYN.

## 2016-12-03 NOTE — Progress Notes (Signed)
Molly Torres , [birth year], 36 y.o., female MRN: [id number] Patient Care Team    Relationship Specialty Notifications Start End  Molly Torres, Molly Torres, Molly Torres PCP - General Family Medicine  09/02/16   Molly Torres, Sandra, Molly Torres Consulting Physician Obstetrics and Gynecology  09/02/16     Chief Complaint  Patient presents with  . Weight Gain    hair loss,fatigue,insomnia     Subjective: Patient presents with Torres constellation of symptoms surrounding hair loss, fatigue, weight gain and difficulty sleeping. She was seen by her gynecologist recently who has performed many female hormone labs, none of which we have records of. Patient states that she was told she had low progesterone. She was given Torres prescription for progesterone by her gynecologist. When patient first established in June, she had had Torres miscarriage. She reports she was told she had Torres another early miscarriage in August. Her hCG is being monitored until reaches 0 through her gynecology office. She is confused on whether she is ovulating or not, because she was told her progesterone at 21 days of flow, therefore started on progesterone but and was told she was pregnant/miscarrying. Her concerns today are to see if she needs further workup by an endocrinologist. She is uncertain if she needs Torres reproductive endocrinologist versus her regular endocrinologist because she was told that she had thyroid disorder in the past. She has had an iodine tracer  and treated with methimazole in the past.  [facility] screen [facility] 2/9 09/02/2016  Decreased Interest 0  Down, Depressed, Hopeless 0  PHQ - 2 Score 0    Allergies  Allergen Reactions  . Poultry Meal     Other reaction(s): Edema   Social History  Substance Use Topics  . Smoking status: Never Smoker  . Smokeless tobacco: Never Used  . Alcohol use 1.2 - 1.8 oz/week    1 - 2 Glasses of wine, 1 Cans of beer per week   Past Medical History:  Diagnosis Date  . Allergy   . Asthma    not as much as an  issue since moving.   . Blood type Torres+ 07/23/2016  . Chicken pox   . 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 Miscarriage within last 12 months 08/22/2016  . Thyroid disease    hyperthyroid  . Vitamin D insufficiency    Past Surgical History:  Procedure Laterality Date  . NO PAST SURGERIES     Family History  Problem Relation Age of Onset  . Arthritis Mother   . COPD Mother   . Kidney failure Mother   . Stomach cancer Father   . Asthma Father   . Brain cancer Paternal Aunt   . Heart disease Maternal Grandmother   . Stroke Maternal Grandfather   . Liver disease Paternal Grandmother   . Stroke Paternal Grandfather   . Celiac disease Paternal Aunt    Allergies as of 12/03/2016      Reactions   Poultry Meal    Other reaction(s): Edema      Medication List       Accurate as of 12/03/16  3:13 PM. Always use your most recent med list.          ADVAIR DISKUS 250-50 MCG/DOSE Aepb Generic drug:  Fluticasone-Salmeterol Inhale 1 puff into the lungs 2 (two) times daily.   Albuterol Sulfate 108 (90 Base) MCG/ACT Aepb Inhale 1 puff into the lungs every 4 (four) hours as needed.  omeprazole 40 MG capsule Commonly known as:  PRILOSEC Take 1 capsule (40 mg total) by mouth daily.   PROBIOTIC ADVANCED PO Take 1 tablet by mouth daily.       All past medical history, surgical history, allergies, family history, immunizations andmedications were updated in the EMR today and reviewed under the history and medication portions of their EMR.     ROS: Negative, with the exception of above mentioned in HPI   Objective:  BP (!) 134/92 (BP Location: Right Arm, Patient Position: Sitting, Cuff Size: Normal)   Pulse 63   Temp 98.1 F (36.7 C)   Wt 146 lb 4 oz (66.3 kg)   SpO2 99%   BMI 25.10 kg/m  Body mass index is 25.1 kg/m. Gen: Afebrile. No acute distress. Nontoxic in appearance, well developed, well nourished.  Appears anxious. HENT: AT. [place].  MMM, no oral lesions Eyes:Pupils Equal Round Reactive to light, Extraocular movements intact,  Conjunctiva without redness, discharge or icterus. Neck/lymp/endocrine: Supple, no lymphadenopathy, no thyromegaly, no thyroid nodules palpated CV: RRR no murmur, no edema Chest: CTAB, no wheeze or crackles. Good air movement, normal resp effort.  Abd: Soft. Flat. NTND. BS present. No Masses palpated. No rebound or guarding.  Skin: no rashes, purpura or petechiae. Mild acne, scarring chin.  Neuro: Normal gait. PERLA. EOMi. Alert. Oriented x3  Psych: Anxious, otherwise Normal affect, dress and demeanor. Normal speech. Normal thought content and judgment.  No exam data present No results found. No results found for this or any previous visit (from the past 24 hour(s)).  Assessment/Plan: Molly Torres is Torres 36 y.o. female present for OV for  Hair loss Abnormal menstrual cycle Other fatigue Weight gain H/O Hyperthyroidism - Patient has continued to gain weight since first establishing 3 months ago. She has been evaluated with gastroenterology. She has been evaluated by her gynecologist. It does sound as if she's had 2 miscarriages within last 3 months, although full records have not been received. She has had Torres history of hyperthyroidism in the past, treated with methimazole. We'll collect Torres thyroid panel today, CBC, iron panel and vitamin D. Patient has had recent CMP which was normal. - Encourage her to follow the recommendations by gynecology. I would not recommend anything different, especially with no records or laboratory results that were obtained. - Depending upon her thyroid results, will refer her to either endocrinology or reproductive endocrinology for second opinion. - TSH - T3, free - T4, free - Ferritin - Iron and TIBC - VITAMIN D 25 Hydroxy (Vit-D Deficiency, Fractures) - Follow-up when necessary    Reviewed expectations re: course of current  medical issues.  Discussed self-management of symptoms.  Outlined signs and symptoms indicating need for more acute intervention.  Patient verbalized understanding and all questions were answered.  Patient received an After-Visit Summary.    No orders of the defined types were placed in this encounter.    Note is dictated utilizing voice recognition software. Although note has been proof read prior to signing, occasional typographical errors still can be missed. If any questions arise, please Molly Torres not hesitate to call for verification.   electronically signed by:  Felix Pacini, Molly Torres  [facility] - [place]

## 2016-12-05 ENCOUNTER — Telehealth: Payer: Self-pay | Admitting: Family Medicine

## 2016-12-05 DIAGNOSIS — N96 Recurrent pregnancy loss: Secondary | ICD-10-CM

## 2016-12-05 DIAGNOSIS — L659 Nonscarring hair loss, unspecified: Secondary | ICD-10-CM | POA: Insufficient documentation

## 2016-12-05 DIAGNOSIS — E0590 Thyrotoxicosis, unspecified without thyrotoxic crisis or storm: Secondary | ICD-10-CM | POA: Insufficient documentation

## 2016-12-05 DIAGNOSIS — N926 Irregular menstruation, unspecified: Secondary | ICD-10-CM | POA: Insufficient documentation

## 2016-12-05 DIAGNOSIS — R7989 Other specified abnormal findings of blood chemistry: Secondary | ICD-10-CM

## 2016-12-05 DIAGNOSIS — R5383 Other fatigue: Secondary | ICD-10-CM

## 2016-12-05 DIAGNOSIS — R635 Abnormal weight gain: Secondary | ICD-10-CM

## 2016-12-05 LAB — CBC WITH DIFFERENTIAL/PLATELET
BASOS ABS: 28 {cells}/uL (ref 0–200)
Basophils Relative: 0.4 %
Eosinophils Absolute: 149 cells/uL (ref 15–500)
Eosinophils Relative: 2.1 %
HCT: 41.2 % (ref 35.0–45.0)
Hemoglobin: 13.8 g/dL (ref 11.7–15.5)
Lymphs Abs: 2293 cells/uL (ref 850–3900)
MCH: 29.7 pg (ref 27.0–33.0)
MCHC: 33.5 g/dL (ref 32.0–36.0)
MCV: 88.6 fL (ref 80.0–100.0)
MPV: 11.9 fL (ref 7.5–12.5)
Monocytes Relative: 7 %
NEUTROS PCT: 58.2 %
Neutro Abs: 4132 cells/uL (ref 1500–7800)
PLATELETS: 233 10*3/uL (ref 140–400)
RBC: 4.65 10*6/uL (ref 3.80–5.10)
RDW: 12.1 % (ref 11.0–15.0)
TOTAL LYMPHOCYTE: 32.3 %
WBC: 7.1 10*3/uL (ref 3.8–10.8)
WBCMIX: 497 {cells}/uL (ref 200–950)

## 2016-12-05 LAB — TEST AUTHORIZATION

## 2016-12-05 LAB — VITAMIN D 25 HYDROXY (VIT D DEFICIENCY, FRACTURES): VIT D 25 HYDROXY: 65 ng/mL (ref 30–100)

## 2016-12-05 LAB — TSH: TSH: 1.73 m[IU]/L

## 2016-12-05 LAB — FERRITIN: Ferritin: 23 ng/mL (ref 10–154)

## 2016-12-05 LAB — IRON AND TIBC
IRON: 103 ug/dL (ref 27–159)
Iron Saturation: 33 % (ref 15–55)
TIBC: 308 ug/dL (ref 250–450)
UIBC: 205 ug/dL (ref 131–425)

## 2016-12-05 LAB — T4, FREE: Free T4: 1.3 ng/dL (ref 0.8–1.8)

## 2016-12-05 LAB — T3, FREE: T3 FREE: 2.9 pg/mL (ref 2.3–4.2)

## 2016-12-05 NOTE — Telephone Encounter (Signed)
Spoke with patient reviewed lab results and information. Patient verbalized understanding . 

## 2016-12-05 NOTE — Telephone Encounter (Signed)
Please call pt: - her labs have returned all normal. I am still waiting on vit d results, but did not want her to wait for all results over the weekend.  Since her thyroid is normal, I have referred her to the reproductive endocrinologist  For their opinion.  - we will call her next with vit d results if abnormal only. Otherwise they will release to my chart if normal.

## 2016-12-24 ENCOUNTER — Encounter: Payer: Self-pay | Admitting: Family Medicine

## 2016-12-24 DIAGNOSIS — E559 Vitamin D deficiency, unspecified: Secondary | ICD-10-CM | POA: Insufficient documentation

## 2017-01-16 ENCOUNTER — Encounter: Payer: Self-pay | Admitting: *Deleted

## 2017-01-30 ENCOUNTER — Ambulatory Visit: Payer: 59 | Admitting: Family Medicine

## 2017-01-30 ENCOUNTER — Encounter: Payer: Self-pay | Admitting: Family Medicine

## 2017-01-30 DIAGNOSIS — J45909 Unspecified asthma, uncomplicated: Secondary | ICD-10-CM | POA: Insufficient documentation

## 2017-01-30 DIAGNOSIS — J4521 Mild intermittent asthma with (acute) exacerbation: Secondary | ICD-10-CM

## 2017-01-30 MED ORDER — AZITHROMYCIN 250 MG PO TABS: ORAL_TABLET | ORAL | 0 refills | Status: DC

## 2017-01-30 MED ORDER — ALBUTEROL SULFATE 108 (90 BASE) MCG/ACT IN AEPB: 1.0000 | INHALATION_SPRAY | RESPIRATORY_TRACT | 11 refills | Status: DC | PRN

## 2017-01-30 MED ORDER — PREDNISONE 50 MG PO TABS: ORAL_TABLET | ORAL | 0 refills | Status: DC

## 2017-01-30 MED ORDER — IPRATROPIUM-ALBUTEROL 0.5-2.5 (3) MG/3ML IN SOLN
3.0000 mL | Freq: Once | RESPIRATORY_TRACT | Status: AC
  Administered 2017-01-30: 3 mL via RESPIRATORY_TRACT

## 2017-01-30 MED ORDER — FLUTICASONE-SALMETEROL 250-50 MCG/DOSE IN AEPB: 1.0000 | INHALATION_SPRAY | Freq: Two times a day (BID) | RESPIRATORY_TRACT | 11 refills | Status: DC

## 2017-01-30 NOTE — Progress Notes (Signed)
Molly Torres , Apr 25, 1980, 36 y.o., female MRN: [id number] Patient Care Team    Relationship Specialty Notifications Start End  Natalia LeatherwoodKuneff, Kamaal Cast A, DO PCP - General Family Medicine  09/02/16   Silverio Layivard, Sandra, MD Consulting Physician Obstetrics and Gynecology  09/02/16     Chief Complaint  Patient presents with  . URI    cough,drainage,     Subjective: Pt presents for an OV with complaints of cough of 2-3 weeks duration.  Associated symptoms include nasal congestion, chest tightness/sob. She has a h/o asthma but has not needed medication in  A rather long time since moving to the area. She has been prescribed advair, breo ellipita and albuterol in the past. She has been routinely taking zyrtec and Flonase nasal spray. She denies fever, chills, nausea or vomit.   [facility] 09/02/2016  Decreased Interest 0  Down, Depressed, Hopeless 0  PHQ - 2 Score 0    Allergies  Allergen Reactions  . Poultry Meal     Other reaction(s): Edema   Social History   Tobacco Use  . Smoking status: Never Smoker  . Smokeless tobacco: Never Used  Substance Use Topics  . Alcohol use: Yes    Alcohol/week: 1.2 - 1.8 oz    Types: 1 - 2 Glasses of wine, 1 Cans of beer per week   Past Medical History:  Diagnosis Date  . Allergy   . Asthma    not as much as an issue since moving.   . Blood type A+ 07/23/2016  . Chicken pox   . 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 Miscarriage within last 12 months 08/22/2016  . Thyroid disease    hyperthyroid  . Vitamin D insufficiency    Past Surgical History:  Procedure Laterality Date  . NO PAST SURGERIES     Family History  Problem Relation Age of Onset  . Arthritis Mother   . COPD Mother   . Kidney failure Mother   . Stomach cancer Father   . Asthma Father   . Brain cancer Paternal Aunt   . Heart disease Maternal Grandmother   . Stroke Maternal Grandfather   . Liver  disease Paternal Grandmother   . Stroke Paternal Grandfather   . Celiac disease Paternal Aunt    Allergies as of 01/30/2017      Reactions   Poultry Meal    Other reaction(s): Edema      Medication List        Accurate as of 01/30/17  1:34 PM. Always use your most recent med list.          ADVAIR DISKUS 250-50 MCG/DOSE Aepb Generic drug:  Fluticasone-Salmeterol Inhale 1 puff into the lungs 2 (two) times daily.   Albuterol Sulfate 108 (90 Base) MCG/ACT Aepb Inhale 1 puff into the lungs every 4 (four) hours as needed.   letrozole 2.5 MG tablet Commonly known as:  FEMARA TAKE 1 TALET BY MOUTH DAILY DAY 3 TO DAY 7   PROBIOTIC ADVANCED PO Take 1 tablet by mouth daily.       All past medical history, surgical history, allergies, family history, immunizations andmedications were updated in the EMR today and reviewed under the history and medication portions of their EMR.     ROS: Negative, with the exception of above mentioned in HPI   Objective:  BP 99/64 (BP Location: Left Arm, Patient Position: Sitting, Cuff Size: Normal)  Pulse 74   Temp 97.7 F (36.5 C)   Resp 20   Wt 146 lb 8 oz (66.5 kg)   SpO2 100%   BMI 25.15 kg/m  Body mass index is 25.15 kg/m. Gen: Afebrile. No acute distress. Nontoxic in appearance, well developed, well nourished.  HENT: AT. [place]. Bilateral TM visualized without erythema, or bulging. MMM, no oral lesions. Bilateral nares with mild erythema, no swelling or drainage. Throat without erythema or exudates. PND present. Barking cough present, no hoarseness or sinus pressure.  Eyes:Pupils Equal Round Reactive to light, Extraocular movements intact,  Conjunctiva without redness, discharge or icterus. Neck/lymp/endocrine: Supple,no lymphadenopathy CV: RRR  Chest: wheeze present LLL. Otherwise CTAB, no wheeze or crackles. Good air movement, normal resp effort. Cough with inspiration.  Neuro: Normal gait. PERLA. EOMi. Alert. Oriented x3  No exam data  present No results found. No results found for this or any previous visit (from the past 24 hour(s)).  Assessment/Plan: Molly Torres is a 36 y.o. female present for OV for  Mild intermittent asthma with acute exacerbation - Duoneb treatment cleared lung sounds. Asthma exacerbation.  - restart advair, prescribed today.  - continue antihistamine, prednisone burst prescribed, albuterol prescribed. Z-pack. - Fluticasone-Salmeterol (ADVAIR DISKUS) 250-50 MCG/DOSE AEPB; Inhale 1 puff 2 (two) times daily into the lungs.  Dispense: 60 each; Refill: 11 - Albuterol Sulfate 108 (90 Base) MCG/ACT AEPB; Inhale 1 puff every 4 (four) hours as needed into the lungs.  Dispense: 1 each; Refill: 11 - azithromycin (ZITHROMAX) 250 MG tablet; 500mg  day 1, then 250 mg QD  Dispense: 6 tablet; Refill: 0 - predniSONE (DELTASONE) 50 MG tablet; Take 1 tab for 5 days with food  Dispense: 5 tablet; Refill: 0 - ipratropium-albuterol (DUONEB) 0.5-2.5 (3) MG/3ML nebulizer solution 3 mL - I f does not resolve would try xyzal and Singulair.   Reviewed expectations re: course of current medical issues.  Discussed self-management of symptoms.  Outlined signs and symptoms indicating need for more acute intervention.  Patient verbalized understanding and all questions were answered.  Patient received an After-Visit Summary.    No orders of the defined types were placed in this encounter.    Note is dictated utilizing voice recognition software. Although note has been proof read prior to signing, occasional typographical errors still can be missed. If any questions arise, please do not hesitate to call for verification.   electronically signed by:  Felix Pacinienee Freemon Binford, DO  [facility] - [place]

## 2017-01-30 NOTE — Patient Instructions (Signed)
Acute Bronchitis, Adult Acute bronchitis is when air tubes (bronchi) in the lungs suddenly get swollen. The condition can make it hard to breathe. It can also cause these symptoms:  A cough.  Coughing up clear, yellow, or green mucus.  Wheezing.  Chest congestion.  Shortness of breath.  A fever.  Body aches.  Chills.  A sore throat.  Follow these instructions at home: Medicines  Take over-the-counter and prescription medicines only as told by your doctor.  If you were prescribed an antibiotic medicine, take it as told by your doctor. Do not stop taking the antibiotic even if you start to feel better. General instructions  Rest.  Drink enough fluids to keep your pee (urine) clear or pale yellow.  Avoid smoking and secondhand smoke. If you smoke and you need help quitting, ask your doctor. Quitting will help your lungs heal faster.  Use an inhaler, cool mist vaporizer, or humidifier as told by your doctor.  Keep all follow-up visits as told by your doctor. This is important. How is this prevented? To lower your risk of getting this condition again:  Wash your hands often with soap and water. If you cannot use soap and water, use hand sanitizer.  Avoid contact with people who have cold symptoms.  Try not to touch your hands to your mouth, nose, or eyes.  Make sure to get the flu shot every year.  Contact a doctor if:  Your symptoms do not get better in 2 weeks. Get help right away if:  You cough up blood.  You have chest pain.  You have very bad shortness of breath.  You become dehydrated.  You faint (pass out) or keep feeling like you are going to pass out.  You keep throwing up (vomiting).  You have a very bad headache.  Your fever or chills gets worse. This information is not intended to replace advice given to you by your health care provider. Make sure you discuss any questions you have with your health care provider. Document Released:  08/27/2007 Document Revised: 10/17/2015 Document Reviewed: 08/29/2015 Elsevier Interactive Patient Education  2017 [employer]   I have refilled your albuterol inhaler and advair. Start advair everyday.  Use antihistamine everyday. Prednisone burst  And Z-pack for 5 days.   If not resolved in 2 weeks, then please be seen.

## 2017-04-24 DIAGNOSIS — Z3141 Encounter for fertility testing: Secondary | ICD-10-CM | POA: Diagnosis not present

## 2017-05-14 DIAGNOSIS — Z3141 Encounter for fertility testing: Secondary | ICD-10-CM | POA: Diagnosis not present

## 2017-05-25 DIAGNOSIS — Z3141 Encounter for fertility testing: Secondary | ICD-10-CM | POA: Diagnosis not present

## 2017-06-19 DIAGNOSIS — Z3141 Encounter for fertility testing: Secondary | ICD-10-CM | POA: Diagnosis not present

## 2017-07-13 DIAGNOSIS — Z3141 Encounter for fertility testing: Secondary | ICD-10-CM | POA: Diagnosis not present

## 2017-07-13 DIAGNOSIS — Z3200 Encounter for pregnancy test, result unknown: Secondary | ICD-10-CM | POA: Diagnosis not present

## 2017-08-06 DIAGNOSIS — Z3200 Encounter for pregnancy test, result unknown: Secondary | ICD-10-CM | POA: Diagnosis not present

## 2017-08-10 DIAGNOSIS — Z3200 Encounter for pregnancy test, result unknown: Secondary | ICD-10-CM | POA: Diagnosis not present

## 2017-08-18 ENCOUNTER — Ambulatory Visit: Payer: Commercial Managed Care - PPO | Admitting: Family Medicine

## 2017-08-18 ENCOUNTER — Encounter: Payer: Self-pay | Admitting: Family Medicine

## 2017-08-18 VITALS — BP 109/75 | HR 88 | Temp 98.3°F | Resp 16 | Ht 64.0 in | Wt 140.2 lb

## 2017-08-18 DIAGNOSIS — R350 Frequency of micturition: Secondary | ICD-10-CM | POA: Diagnosis not present

## 2017-08-18 DIAGNOSIS — R300 Dysuria: Secondary | ICD-10-CM | POA: Diagnosis not present

## 2017-08-18 LAB — POC URINALSYSI DIPSTICK (AUTOMATED)
BILIRUBIN UA: NEGATIVE
GLUCOSE UA: NEGATIVE
KETONES UA: NEGATIVE
NITRITE UA: NEGATIVE
Protein, UA: NEGATIVE
Spec Grav, UA: 1.01 (ref 1.010–1.025)
Urobilinogen, UA: 0.2 E.U./dL
pH, UA: 6 (ref 5.0–8.0)

## 2017-08-18 MED ORDER — CEPHALEXIN 500 MG PO CAPS: 500.0000 mg | ORAL_CAPSULE | Freq: Four times a day (QID) | ORAL | 0 refills | Status: DC

## 2017-08-18 NOTE — Progress Notes (Signed)
Molly Torres , 10-10-1980, 37 y.o., female MRN: [id number] Patient Care Team    Relationship Specialty Notifications Start End  Natalia Leatherwood, DO PCP - General Family Medicine  09/02/16   Silverio Lay, MD Consulting Physician Obstetrics and Gynecology  09/02/16     Chief Complaint  Patient presents with  . Urinary Tract Infection    ?     Subjective: Pt presents for an OV with complaints of dysuria and urinary frequency of 2 days duration.  Associated symptoms include bladder pressure. She denies fever, chills, nausea, vomit or decreased UOP. She is eating and drinking well. Patient's last menstrual period was 08/29/2016. Per chart [id number]. Pt did not mention today.     Depression screen [facility] 2/9 09/02/2016  Decreased Interest 0  Down, Depressed, Hopeless 0  PHQ - 2 Score 0    Allergies  Allergen Reactions  . Poultry Meal     Other reaction(s): Edema   Social History   Tobacco Use  . Smoking status: Never Smoker  . Smokeless tobacco: Never Used  Substance Use Topics  . Alcohol use: Yes    Alcohol/week: 1.2 - 1.8 oz    Types: 1 - 2 Glasses of wine, 1 Cans of beer per week   Past Medical History:  Diagnosis Date  . Allergy   . Asthma    not as much as an issue since moving.   . Blood type A+ 07/23/2016  . Chicken pox   . 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 Miscarriage within last 12 months 08/22/2016  . Thyroid disease    hyperthyroid  . Vitamin D insufficiency    Past Surgical History:  Procedure Laterality Date  . NO PAST SURGERIES     Family History  Problem Relation Age of Onset  . Arthritis Mother   . COPD Mother   . Kidney failure Mother   . Stomach cancer Father   . Asthma Father   . Brain cancer Paternal Aunt   . Heart disease Maternal Grandmother   . Stroke Maternal Grandfather   . Liver disease Paternal Grandmother   . Stroke Paternal Grandfather   . Celiac disease  Paternal Aunt    Allergies as of 08/18/2017      Reactions   Poultry Meal    Other reaction(s): Edema      Medication List        Accurate as of 08/18/17  3:19 PM. Always use your most recent med list.          Albuterol Sulfate 108 (90 Base) MCG/ACT Aepb Inhale 1 puff every 4 (four) hours as needed into the lungs.   cholecalciferol 400 units Tabs tablet Commonly known as:  VITAMIN D Take 400 Units by mouth 2 (two) times daily.   fluticasone 50 MCG/ACT nasal spray Commonly known as:  FLONASE Place 1 spray into both nostrils daily.   Fluticasone-Salmeterol 250-50 MCG/DOSE Aepb Commonly known as:  ADVAIR DISKUS Inhale 1 puff 2 (two) times daily into the lungs.   PRENATAL PO Take 1 capsule by mouth daily.   progesterone 200 MG capsule Commonly known as:  PROMETRIUM Place 1 capsule vaginally 2 (two) times daily.       All past medical history, surgical history, allergies, family history, immunizations andmedications were updated in the EMR today and reviewed under the history and medication portions of their EMR.     ROS: Negative, with the  exception of above mentioned in HPI   Objective:  BP 109/75 (BP Location: Left Arm, Patient Position: Sitting, Cuff Size: Normal)   Pulse (!) 105   Temp 98.3 F (36.8 C) (Oral)   Resp 16   Ht  (1.626 m)   Wt 140 lb 4 oz (63.6 kg)   LMP 08/29/2016   SpO2 98%   BMI 24.07 kg/m  Body mass index is 24.07 kg/m. Gen: Afebrile. No acute distress. Nontoxic in appearance, well developed, well nourished.  HENT: AT. [place]. . MMM, no oral lesions.  Eyes:Pupils Equal Round Reactive to light, Extraocular movements intact,  Conjunctiva without redness, discharge or icterus. CV: RRR  Chest: CTAB, no wheeze or crackles. Good air movement, normal resp effort.  Abd: Soft. NTND. BS present. no Masses palpated. No rebound or guarding.  MSK: no cva tenderness.  Neuro:  Normal gait. PERLA. EOMi. Alert. Oriented x3  No exam data present No  results found. Results for orders placed or performed in visit on 08/18/17 (from the past 24 hour(s))  POCT Urinalysis Dipstick (Automated)     Status: Abnormal   Collection Time: 08/18/17  3:16 PM  Result Value Ref Range   Color, UA yellow    Clarity, UA clear    Glucose, UA Negative Negative   Bilirubin, UA Negative    Ketones, UA Negative    Spec Grav, UA 1.010 1.010 - 1.025   Blood, UA 1+    pH, UA 6.0 5.0 - 8.0   Protein, UA Negative Negative   Urobilinogen, UA 0.2 0.2 or 1.0 E.U./dL   Nitrite, UA Negative    Leukocytes, UA Small (1+) (A) Negative    Assessment/Plan: ZOIE SARIN is a 37 y.o. female present for OV for  Urinary frequency/dysuria - POCT with leuks and BID today--> tx w/ keflex prophylactic. Urine culture sent.  - POCT Urinalysis Dipstick (Automated) - Urine Culture - F/u 1-2 week PRN   Reviewed expectations re: course of current medical issues.  Discussed self-management of symptoms.  Outlined signs and symptoms indicating need for more acute intervention.  Patient verbalized understanding and all questions were answered.  Patient received an After-Visit Summary.    Orders Placed This Encounter  Procedures  . Urine Culture  . POCT Urinalysis Dipstick (Automated)     Note is dictated utilizing voice recognition software. Although note has been proof read prior to signing, occasional typographical errors still can be missed. If any questions arise, please do not hesitate to call for verification.   electronically signed by:  Felix Pacini, DO  [facility] - [place]

## 2017-08-18 NOTE — Patient Instructions (Signed)
Start keflex every 6 hours.  We will send your urine for culture and call you with results once we receive results.   Drink plenty water.

## 2017-08-19 LAB — URINE CULTURE
MICRO NUMBER:: 90643596
SPECIMEN QUALITY:: ADEQUATE

## 2017-08-25 DIAGNOSIS — Z3490 Encounter for supervision of normal pregnancy, unspecified, unspecified trimester: Secondary | ICD-10-CM | POA: Diagnosis not present

## 2017-08-31 DIAGNOSIS — Z3A00 Weeks of gestation of pregnancy not specified: Secondary | ICD-10-CM | POA: Diagnosis not present

## 2017-08-31 DIAGNOSIS — O021 Missed abortion: Secondary | ICD-10-CM | POA: Diagnosis not present

## 2017-09-04 DIAGNOSIS — Z3A00 Weeks of gestation of pregnancy not specified: Secondary | ICD-10-CM | POA: Diagnosis not present

## 2017-09-04 DIAGNOSIS — O021 Missed abortion: Secondary | ICD-10-CM | POA: Diagnosis not present

## 2017-09-04 DIAGNOSIS — Z3A01 Less than 8 weeks gestation of pregnancy: Secondary | ICD-10-CM | POA: Diagnosis not present

## 2017-09-15 ENCOUNTER — Telehealth: Payer: Self-pay | Admitting: *Deleted

## 2017-09-15 NOTE — Telephone Encounter (Signed)
Copied from CRM [phone]. Topic: Appointment Scheduling - Scheduling Inquiry for Clinic >> Sep 14, 2017  8:45 AM Leafy Roobinson, Norma J wrote: Reason for CRM: pt was last seen may 28-19. Pt would like to know if md would order blood work to check her autoimmune system. Pt took OTC test( life 2  test) and her creatinine and microalbuminuria was elevated. Pt is still have symptoms for may 28. Pt still has protein in urine per life 2 test >> Sep 15, 2017  9:18 AM Windy KalataMichael, Taylor L, NT wrote: Patient is calling to follow up on this. Please advise.

## 2017-09-15 NOTE — Telephone Encounter (Signed)
Spoke with patient scheduled appt for evaluation . 

## 2017-09-18 ENCOUNTER — Encounter: Payer: Self-pay | Admitting: Family Medicine

## 2017-09-18 ENCOUNTER — Telehealth: Payer: Self-pay | Admitting: Family Medicine

## 2017-09-18 ENCOUNTER — Ambulatory Visit: Payer: Commercial Managed Care - PPO | Admitting: Family Medicine

## 2017-09-18 VITALS — BP 120/82 | HR 114 | Temp 98.0°F | Resp 16 | Ht 64.0 in | Wt 142.0 lb

## 2017-09-18 DIAGNOSIS — R21 Rash and other nonspecific skin eruption: Secondary | ICD-10-CM | POA: Diagnosis not present

## 2017-09-18 DIAGNOSIS — E0590 Thyrotoxicosis, unspecified without thyrotoxic crisis or storm: Secondary | ICD-10-CM

## 2017-09-18 DIAGNOSIS — N96 Recurrent pregnancy loss: Secondary | ICD-10-CM | POA: Diagnosis not present

## 2017-09-18 DIAGNOSIS — R300 Dysuria: Secondary | ICD-10-CM | POA: Diagnosis not present

## 2017-09-18 DIAGNOSIS — L509 Urticaria, unspecified: Secondary | ICD-10-CM | POA: Insufficient documentation

## 2017-09-18 LAB — POCT URINALYSIS DIPSTICK
Bilirubin, UA: NEGATIVE
Blood, UA: NEGATIVE
GLUCOSE UA: NEGATIVE
Ketones, UA: NEGATIVE
Nitrite, UA: NEGATIVE
Protein, UA: NEGATIVE
Spec Grav, UA: 1.015 (ref 1.010–1.025)
Urobilinogen, UA: 0.2 E.U./dL
pH, UA: 7 (ref 5.0–8.0)

## 2017-09-18 LAB — T3, FREE: T3 FREE: 6.6 pg/mL — AB (ref 2.3–4.2)

## 2017-09-18 LAB — TSH

## 2017-09-18 LAB — T4, FREE: FREE T4: 1.89 ng/dL — AB (ref 0.60–1.60)

## 2017-09-18 MED ORDER — RANITIDINE HCL 150 MG PO TABS
150.0000 mg | ORAL_TABLET | Freq: Two times a day (BID) | ORAL | 2 refills | Status: AC
Start: 2017-09-18 — End: ?

## 2017-09-18 MED ORDER — LEVOCETIRIZINE DIHYDROCHLORIDE 5 MG PO TABS: 5.0000 mg | ORAL_TABLET | Freq: Every evening | ORAL | 0 refills | Status: DC

## 2017-09-18 MED ORDER — SERTRALINE HCL 50 MG PO TABS: 50.0000 mg | ORAL_TABLET | Freq: Every day | ORAL | 5 refills | Status: AC

## 2017-09-18 NOTE — Patient Instructions (Addendum)
Hive: Start xyzal at night. Start zantac every 12 hours Depression/anxiety: Start zoloft 50 mg a day.   Follow up here in 6 months if doing well, 3 months if you feel you need adjustment on dose.   If you become pregnant before we see each other again, make sure your OB approves all your meds   Please help us help you:  We are honored [facility]. Below you will find basic instructions that you may need to access in the future. Please help us help you by reading the instructions, which cover many of the frequent questions we experience.   Prescription refills and request:  -In order to allow more efficient response time, please call your pharmacy for all refills. They will forward the request electronically to us. This allows for the quickest possible response. Request left on a nurse line can take longer to refill, since these are checked as time allows between office patients and other phone calls.  - refill request can take up to 3-5 working days to complete.  - If request is sent electronically and request is appropiate, it is usually completed in 1-2 business days.  - all patients will need to be seen routinely for all chronic medical conditions requiring prescription medications (see follow-up below). If you are overdue for follow up on your condition, you will be asked to make an appointment and we will call in enough medication to cover you until your appointment (up to 30 days).  - all controlled substances will require a face to face visit to request/refill.  - if you desire your prescriptions to go through a new pharmacy, and have an active script at original pharmacy, you will need to call your pharmacy and have scripts transferred to new pharmacy. This is completed between the pharmacy locations and not by your provider.    Results: If any images or labs were ordered, it can take up to 1 week to get results depending on the test  ordered and the lab/facility running and resulting the test. - Normal or stable results, which do not need further discussion, may be released to your mychart immediately with attached note to you. A call may not be generated for normal results. Please make certain to sign up for mychart. If you have questions on how to activate your mychart you can call the front office.  - If your results need further discussion, our office will attempt to contact you via phone, and if unable to reach you after 2 attempts, we will release your abnormal result to your mychart with instructions.  - All results will be automatically released in mychart after 1 week.  - Your provider will provide you with explanation and instruction on all relevant material in your results. Please keep in mind, results and labs may appear confusing or abnormal to the untrained eye, but it does not mean they are actually abnormal for you personally. If you have any questions about your results that are not covered, or you desire more detailed explanation than what was provided, you should make an appointment with your provider to do so.   Our office handles many outgoing and incoming calls daily. If we have not contacted you within 1 week about your results, please check your mychart to see if there is a message first and if not, then contact our office.  In helping with this matter, you help decrease call volume, and therefore allow  [place] to be able to respond to patients needs more efficiently.   Acute office visits (sick visit):  An acute visit is intended for a new problem and are scheduled in shorter time slots to allow schedule openings for patients with new problems. This is the appropriate visit to discuss a new problem. Problems will not be addressed by phone call or Echart message. Appointment is needed if requesting treatment. In order to provide you with excellent quality medical care with proper time for you to explain your problem,  have an exam and receive treatment with instructions, these appointments should be limited to one new problem per visit. If you experience a new problem, in which you desire to be addressed, please make an acute office visit, we save openings on the schedule to accommodate you. Please do not save your new problem for any other type of visit, let us take care of it properly and quickly for you.   Follow up visits:  Depending on your condition(s) your provider will need to see you routinely in order to provide you with quality care and prescribe medication(s). Most chronic conditions (Example: hypertension, Diabetes, depression/anxiety... etc), require visits a couple times a year. Your provider will instruct you on proper follow up for your personal medical conditions and history. Please make certain to make follow up appointments for your condition as instructed. Failing to do so could result in lapse in your medication treatment/refills. If you request a refill, and are overdue to be seen on a condition, we will always provide you with a 30 day script (once) to allow you time to schedule.    Medicare wellness (well visit): - we have a wonderful Nurse Maudie Mercury), that will meet with you and provide you will yearly medicare wellness visits. These visits should occur yearly (can not be scheduled less than 1 calendar year apart) and cover preventive health, immunizations, advance directives and screenings you are entitled to yearly through your medicare benefits. Do not miss out on your entitled benefits, this is when medicare will pay for these benefits to be ordered for you.  These are strongly encouraged by your provider and is the appropriate type of visit to make certain you are up to date with all preventive health benefits. If you have not had your medicare wellness exam in the last 12 months, please make certain to schedule one by calling the office and schedule your medicare wellness with Maudie Mercury as soon as  possible.   Yearly physical (well visit):  - Adults are recommended to be seen yearly for physicals. Check with your insurance and date of your last physical, most insurances require one calendar year between physicals. Physicals include all preventive health topics, screenings, medical exam and labs that are appropriate for gender/age and history. You may have fasting labs needed at this visit. This is a well visit (not a sick visit), new problems should not be covered during this visit (see acute visit).  - Pediatric patients are seen more frequently when they are younger. Your provider will advise you on well child visit timing that is appropriate for your their age. - This is not a medicare wellness visit. Medicare wellness exams do not have an exam portion to the visit. Some medicare companies allow for a physical, some do not allow a yearly physical. If your medicare allows a yearly physical you can schedule the medicare wellness with our nurse Maudie Mercury and have your physical with your provider after, on the same day. Please  check with insurance for your full benefits.   Late Policy/No Shows:  - all new patients should arrive 15-30 minutes earlier than appointment to allow [place] time  to  obtain all personal demographics,  insurance information and for you to complete office paperwork. - All established patients should arrive 10-15 minutes earlier than appointment time to update all information and be checked in .  - In our best efforts to run on time, if you are late for your appointment you will be asked to either reschedule or if able, we will work you back into the schedule. There will be a wait time to work you back in the schedule,  depending on availability.  - If you are unable to make it to your appointment as scheduled, please call 24 hours ahead of time to allow [place] to fill the time slot with someone else who needs to be seen. If you do not cancel your appointment ahead of time, you may be charged  a no show fee.

## 2017-09-18 NOTE — Progress Notes (Signed)
Molly Torres , Nov 16, 1980, 37 y.o., female MRN: [id number] Patient Care Team    Relationship Specialty Notifications Start End  Natalia LeatherwoodKuneff, Gerhardt Gleed A, DO PCP - General Family Medicine  09/02/16   Silverio Layivard, Sandra, MD Consulting Physician Obstetrics and Gynecology  09/02/16     Chief Complaint  Patient presents with  . Dysuria    burning with urination, rash - autoimmune testing?     Subjective: Pt presents for an OV with complaints of continue dysuria since negative urine culture 4 week s ago for similar complaint. She denies fever, chills, low back pain. She is urinating appropriately. She did complete the course of keflex.   Rash/miscarriage: Pt reports she has had an itchy red raised rash that is presently daily and then will fade by the day. She has pictures of it today. She has a h/o hyperthyroidism and frequent miscarriages. She had a miscarriage 2-3 weeks ago with a D&C/E followed. She is concerned she has an autoimmune disease that is making her rash and frequent miscarriages. She takes zyrtec daily. She feels very stressed. She has been not wanting medication management for her stress/anxiety, but is open to it now.   [facility]/2016  Decreased Interest 0  Down, Depressed, Hopeless 0  PHQ - 2 Score 0    Allergies  Allergen Reactions  . Poultry Meal     Other reaction(s): Edema   Social History   Tobacco Use  . Smoking status: Never Smoker  . Smokeless tobacco: Never Used  Substance Use Topics  . Alcohol use: Yes    Alcohol/week: 1.2 - 1.8 oz    Types: 1 - 2 Glasses of wine, 1 Cans of beer per week   Past Medical History:  Diagnosis Date  . Allergy   . Asthma    not as much as an issue since moving.   . Blood type A+ 07/23/2016  . Chicken pox   . 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 Miscarriage within last 12 months 08/22/2016  . Thyroid disease    hyperthyroid  . Vitamin D  insufficiency    Past Surgical History:  Procedure Laterality Date  . NO PAST SURGERIES     Family History  Problem Relation Age of Onset  . Arthritis Mother   . COPD Mother   . Kidney failure Mother   . Stomach cancer Father   . Asthma Father   . Brain cancer Paternal Aunt   . Heart disease Maternal Grandmother   . Stroke Maternal Grandfather   . Liver disease Paternal Grandmother   . Stroke Paternal Grandfather   . Celiac disease Paternal Aunt    Allergies as of 09/18/2017      Reactions   Poultry Meal    Other reaction(s): Edema      Medication List        Accurate as of 09/18/17  9:07 AM. Always use your most recent med list.          Albuterol Sulfate 108 (90 Base) MCG/ACT Aepb Inhale 1 puff every 4 (four) hours as needed into the lungs.   cephALEXin 500 MG capsule Commonly known as:  KEFLEX Take 1 capsule (500 mg total) by mouth 4 (four) times daily.   cholecalciferol 400 units Tabs tablet Commonly known as:  VITAMIN D Take 400 Units by mouth 2 (two) times daily.   fluticasone 50 MCG/ACT nasal spray Commonly known as:  FLONASE Place 1 spray into both nostrils daily.   Fluticasone-Salmeterol 250-50 MCG/DOSE Aepb Commonly known as:  ADVAIR DISKUS Inhale 1 puff 2 (two) times daily into the lungs.   letrozole 2.5 MG tablet Commonly known as:  FEMARA letrozole 2.5 mg tablet  TAKE 2 TABLETS BY MOUTH DAILY DAY 3-7 OF MENSTRUAL CYCLE   PRENATAL PO Take 1 capsule by mouth daily.   progesterone 200 MG capsule Commonly known as:  PROMETRIUM Place 1 capsule vaginally 2 (two) times daily.       All past medical history, surgical history, allergies, family history, immunizations andmedications were updated in the EMR today and reviewed under the history and medication portions of their EMR.     ROS: Negative, with the exception of above mentioned in HPI   Objective:  BP 120/82 (BP Location: Right Arm, Patient Position: Sitting, Cuff Size: Normal)    Pulse (!) 114   Temp 98 F (36.7 C) (Oral)   Resp 16   Ht 5\' 4"  (1.626 m)   Wt 142 lb (64.4 kg)   SpO2 98%   BMI 24.37 kg/m  Body mass index is 24.37 kg/m. Gen: Afebrile. No acute distress. Nontoxic in appearance, well developed, well nourished.  HENT: AT. [place].  MMM Eyes:Pupils Equal Round Reactive to light, Extraocular movements intact,  Conjunctiva without redness, discharge or icterus. Neck/lymp/endocrine: Supple,no lymphadenopathy CV: RRR no murmur, no edema Chest: CTAB, no wheeze or crackles. Good air movement, normal resp effort.  Skin: mild fading raised red macular papular  Rash bilateral legs, purpura or petechiae.  Neuro: Normal gait. PERLA. EOMi. Alert. Oriented x3  Psych: moderately anxious. Normal affect, dress and demeanor. Normal speech. Normal thought content and judgment.  No exam data present No results found. Results for orders placed or performed in visit on 09/18/17 (from the past 24 hour(s))  POCT urinalysis dipstick     Status: Abnormal   Collection Time: 09/18/17  9:01 AM  Result Value Ref Range   Color, UA yellow    Clarity, UA clear    Glucose, UA Negative Negative   Bilirubin, UA Negative    Ketones, UA Negative    Spec Grav, UA 1.015 1.010 - 1.025   Blood, UA Negative    pH, UA 7.0 5.0 - 8.0   Protein, UA Negative Negative   Urobilinogen, UA 0.2 0.2 or 1.0 E.U./dL   Nitrite, UA Negative    Leukocytes, UA Small (1+) (A) Negative   Appearance     Odor      Assessment/Plan: ANGELIGUE Torres is a 37 y.o. female present for OV for  Dysuria Do not suspect infection. 4 week sago treated w/keflex and culture was negative. Suspect gynecological or uro condition. If cultures negative again she should followup with her gyn or we can refer her to urology.  - POCT urinalysis dipstick - Urine Culture  Hyperthyroidism/Urticaria/rash/anxiety - rash appears consistent with hives. She is not planning on pregnancy currently. Will recheck thyroid levels as  potential cause.  - prescribed xyzal QHS for hives and Zantac BID - pt agreeable to zoloft start. Zoloft 50 mg QD prescribed.  - TSH - T4, free - T3, free - Antinuclear Antib (ANA) - advised pt if she decides to try to become pregnant again, she will need to speak with OB on continuing medications.   History of multiple miscarriages - Id ANA + could consider antiphospholipid and SLE labs.  She is having genetic testing completed on the products of conception, so  hopefully she will have more answers soon.  - Antinuclear Antib (ANA)   Reviewed expectations re: course of current medical issues.  Discussed self-management of symptoms.  Outlined signs and symptoms indicating need for more acute intervention.  Patient verbalized understanding and all questions were answered.  Patient received an After-Visit Summary.    Orders Placed This Encounter  Procedures  . POCT urinalysis dipstick     Note is dictated utilizing voice recognition software. Although note has been proof read prior to signing, occasional typographical errors still can be missed. If any questions arise, please do not hesitate to call for verification.   electronically signed by:  Felix Pacini, DO  [facility] - [place]

## 2017-09-18 NOTE — Telephone Encounter (Signed)
Please inform patient the following information: Her thyroid is overactive. I will need to refer her to an endocrinologist to manage. This will need to be under control before considering pregnancy again (recent miscarriage). She has a reproductive endocrinologist, they may manage condition. Please forward them her labs with her permission and have her call them to discuss. If needing endocrine referral instead, please place while I am out for hyperthyroidism for her to be seen urgently.  ANA still pending

## 2017-09-19 LAB — URINE CULTURE
MICRO NUMBER:: 90775495
RESULT: NO GROWTH
SPECIMEN QUALITY: ADEQUATE

## 2017-09-21 LAB — ANA: Anti Nuclear Antibody(ANA): NEGATIVE

## 2017-09-21 NOTE — Telephone Encounter (Signed)
Spoke with patient reviewed lab results and instructions. Patient verbalized understanding. Patient will follow up with Dr Elesa Hackereaton her reproductive endocrinologist. She agreed to have lab results faxed to them. She will call us back if they wont be able to manage this. Lab results faxed to Dr Elesa Hackereaton for review.

## 2017-09-22 ENCOUNTER — Encounter: Payer: Self-pay | Admitting: *Deleted

## 2017-09-22 ENCOUNTER — Encounter: Payer: Self-pay | Admitting: Family Medicine

## 2017-09-22 NOTE — Telephone Encounter (Signed)
Re-faxed lab results to Dr Meridee Scoreeaton's office.

## 2017-09-22 NOTE — Telephone Encounter (Signed)
Pt calling and states Dr. Lucia Gaskinseatons office did not receive her lab results and would like to see if these can be re-faxed.

## 2017-09-23 ENCOUNTER — Encounter: Payer: Self-pay | Admitting: Family Medicine

## 2017-09-23 NOTE — Addendum Note (Signed)
Addended by: Thomasena EdisWILLIAMS, Dashanique Brownstein N on: 09/23/2017 01:20 PM   Modules accepted: Orders

## 2017-09-23 NOTE — Telephone Encounter (Signed)
Referral to Endocrinology placed as directed

## 2017-09-28 ENCOUNTER — Telehealth: Payer: Self-pay | Admitting: Family Medicine

## 2017-09-28 NOTE — Telephone Encounter (Signed)
Left message for patient to return call to discuss iinformation

## 2017-09-28 NOTE — Telephone Encounter (Addendum)
-----   Message -----  From: Molly Torres  Sent: 09/22/2017  2:55 PM  To: Molly Torres Clinical Pool  Subject: Visit Follow-Up Question               ----- Message from Mychart, Generic sent at 09/22/2017 2:55 PM EDT -----    Hi again,    Dr. Elesa Hackereaton passed me along to another endocrinologist where I would be a new patient. I called Dr. Lucianne Torres and because I have been well for 5 years they would also treat me as a new patient. Until I get in with a new endocrinologist which will be more than two weeks at this point, can I try methimazole again which is what I took before for hyperthyroidism?    Thanks,    Molly Torres   Above is [employer] message from pt. I was out of the office last week. I can call in her request, however she should have an appt in a few days correct? If she has established with new endocrinologist and will see them them soon, I would recommend she discuss management with them, especially if she is eventually planning on pregnancy again in the near future, since that makes a difference on her management and medications.  I can call in low dose beta blocker for heart racing/tachycardia (on exam). This med is a symptom helper only and used short term until endocrine manages her thyroid.  - also her there labs were normal.  Please advise.

## 2017-09-30 ENCOUNTER — Encounter: Payer: Self-pay | Admitting: *Deleted

## 2017-09-30 NOTE — Telephone Encounter (Signed)
Sent patient information in My Chart. 

## 2017-09-30 NOTE — Telephone Encounter (Signed)
Left message for patient to return call to discuss iinformation

## 2017-10-15 DIAGNOSIS — E0590 Thyrotoxicosis, unspecified without thyrotoxic crisis or storm: Secondary | ICD-10-CM | POA: Diagnosis not present

## 2017-11-05 DIAGNOSIS — E0500 Thyrotoxicosis with diffuse goiter without thyrotoxic crisis or storm: Secondary | ICD-10-CM | POA: Diagnosis not present

## 2017-11-17 DIAGNOSIS — Z3141 Encounter for fertility testing: Secondary | ICD-10-CM | POA: Diagnosis not present

## 2017-12-14 DIAGNOSIS — Z3141 Encounter for fertility testing: Secondary | ICD-10-CM | POA: Diagnosis not present

## 2017-12-18 DIAGNOSIS — E0590 Thyrotoxicosis, unspecified without thyrotoxic crisis or storm: Secondary | ICD-10-CM | POA: Diagnosis not present

## 2017-12-21 ENCOUNTER — Other Ambulatory Visit: Payer: Self-pay | Admitting: *Deleted

## 2017-12-21 MED ORDER — LEVOCETIRIZINE DIHYDROCHLORIDE 5 MG PO TABS: 5.0000 mg | ORAL_TABLET | Freq: Every evening | ORAL | 0 refills | Status: AC

## 2017-12-21 MED ORDER — LEVOCETIRIZINE DIHYDROCHLORIDE 5 MG PO TABS: 5.0000 mg | ORAL_TABLET | Freq: Every evening | ORAL | 0 refills | Status: DC

## 2018-01-12 DIAGNOSIS — E0590 Thyrotoxicosis, unspecified without thyrotoxic crisis or storm: Secondary | ICD-10-CM | POA: Diagnosis not present

## 2018-01-12 DIAGNOSIS — E0500 Thyrotoxicosis with diffuse goiter without thyrotoxic crisis or storm: Secondary | ICD-10-CM | POA: Diagnosis not present

## 2018-02-10 ENCOUNTER — Encounter: Payer: Self-pay | Admitting: *Deleted

## 2018-02-10 DIAGNOSIS — Z3141 Encounter for fertility testing: Secondary | ICD-10-CM | POA: Diagnosis not present

## 2018-03-25 DIAGNOSIS — M25422 Effusion, left elbow: Secondary | ICD-10-CM | POA: Diagnosis not present

## 2018-03-25 DIAGNOSIS — S59902A Unspecified injury of left elbow, initial encounter: Secondary | ICD-10-CM | POA: Diagnosis not present

## 2018-03-25 DIAGNOSIS — S52125A Nondisplaced fracture of head of left radius, initial encounter for closed fracture: Secondary | ICD-10-CM | POA: Diagnosis not present

## 2018-03-29 DIAGNOSIS — S52125A Nondisplaced fracture of head of left radius, initial encounter for closed fracture: Secondary | ICD-10-CM | POA: Diagnosis not present

## 2018-04-05 DIAGNOSIS — E0590 Thyrotoxicosis, unspecified without thyrotoxic crisis or storm: Secondary | ICD-10-CM | POA: Diagnosis not present

## 2018-04-05 DIAGNOSIS — Z3149 Encounter for other procreative investigation and testing: Secondary | ICD-10-CM | POA: Diagnosis not present

## 2018-04-12 DIAGNOSIS — S52125A Nondisplaced fracture of head of left radius, initial encounter for closed fracture: Secondary | ICD-10-CM | POA: Diagnosis not present

## 2018-07-08 ENCOUNTER — Other Ambulatory Visit: Payer: Self-pay | Admitting: Family Medicine

## 2018-07-08 DIAGNOSIS — J4521 Mild intermittent asthma with (acute) exacerbation: Secondary | ICD-10-CM

## 2018-07-08 MED ORDER — ALBUTEROL SULFATE 108 (90 BASE) MCG/ACT IN AEPB
1.0000 | INHALATION_SPRAY | RESPIRATORY_TRACT | 0 refills | Status: AC | PRN
Start: 2018-07-08 — End: ?

## 2018-07-08 MED ORDER — FLUTICASONE-SALMETEROL 250-50 MCG/DOSE IN AEPB: 1.0000 | INHALATION_SPRAY | Freq: Two times a day (BID) | RESPIRATORY_TRACT | 0 refills | Status: AC

## 2018-07-08 NOTE — Telephone Encounter (Signed)
Requested Prescriptions  Pending Prescriptions Disp Refills  . Albuterol Sulfate 108 (90 Base) MCG/ACT AEPB 1 each 0    Sig: Inhale 1 puff into the lungs every 4 (four) hours as needed.     Pulmonology:  Beta Agonists Failed - 07/08/2018 11:53 AM      Failed - One inhaler should last at least one month. If the patient is requesting refills earlier, contact the patient to check for uncontrolled symptoms.      Passed - Valid encounter within last 12 months    Recent Outpatient Visits          9 months ago Urticaria   [place] [facility], Renee A, DO   10 months ago Urinary frequency   [place] [facility], Renee A, DO   1 year ago Mild intermittent asthma with acute exacerbation   [place] [facility], Renee A, DO   1 year ago Hyperthyroidism   [place] [facility], Renee A, DO   1 year ago Bowel habit changes   [place] [facility], Renee A, DO           . Fluticasone-Salmeterol (ADVAIR DISKUS) 250-50 MCG/DOSE AEPB 60 each 11    Sig: Inhale 1 puff into the lungs 2 (two) times daily.     Pulmonology:  Combination Products Passed - 07/08/2018 11:53 AM      Passed - Valid encounter within last 12 months    Recent Outpatient Visits          9 months ago Urticaria   [place] [facility], Renee A, DO   10 months ago Urinary frequency   [place] [facility], Renee A, DO   1 year ago Mild intermittent asthma with acute exacerbation   [place] [facility], Renee A, DO   1 year ago Hyperthyroidism   [place] [facility], Renee A, DO   1 year ago Bowel habit changes   [place] [facility], Renee A, DO

## 2018-08-06 ENCOUNTER — Other Ambulatory Visit: Payer: Self-pay | Admitting: Family Medicine

## 2018-08-06 DIAGNOSIS — Z8639 Personal history of other endocrine, nutritional and metabolic disease: Secondary | ICD-10-CM | POA: Insufficient documentation

## 2018-08-06 DIAGNOSIS — J4521 Mild intermittent asthma with (acute) exacerbation: Secondary | ICD-10-CM

## 2018-08-06 NOTE — Telephone Encounter (Signed)
Pt was called and message was left for patient to return call to schedule appt

## 2018-08-06 NOTE — Telephone Encounter (Signed)
Received request for pts advair. She is due for follow up. She has not been seen for her asthma in almost 2 years. We had seen for an acute only.  She is due for her physical also. We can cover these together IN PERSON if she wants to schedule a CPE. AND she has no URI symptoms.  Put her on the schedule next week in a 1100 slot for CPE.  If not desiring CPE she can do a virtual visit for her asthma alone. (Her insurance will not cover virtual CPE)  FYI: For next week- no more than 4 in person visits a day.

## 2018-08-10 NOTE — Telephone Encounter (Signed)
Message was left on VM and my chart message was sent to pt about making appt and medication refills.
